# Patient Record
Sex: Female | Born: 1990
Health system: Southern US, Community
[De-identification: ages and names within clinical notes are randomized; demographics above are authoritative.]

## PROBLEM LIST (undated history)

## (undated) DIAGNOSIS — Z789 Other specified health status: Secondary | ICD-10-CM

## (undated) HISTORY — PX: NO PAST SURGERIES: SHX2092

---

## 2016-03-03 NOTE — L&D Delivery Note (Signed)
Delivery Note Pt was noted to be complete at 0553. She pushed well and at 6:31 AM a viable female was delivered via Vaginal, Spontaneous Delivery (Presentation: LOA ).  APGAR: 7, 9; weight: pending .  Cord clamped and cut by FOB; hospital cord blood sample collected. Placenta status: spont, intact .  Cord: 3 vessels  Anesthesia:  Epidural Episiotomy:  None Lacerations: 3rd degree;Perineal; repair performed by Dr Debroah Loop Suture Repair: 3.0 Est. Blood Loss (mL):  500cc  Mom to postpartum.  Baby to Couplet care / Skin to Skin.  Delivery by Dr Wonda Olds; I was present for the delivery and immediate recovery care.  Cam Hai CNM 10/20/2016, 6:56 AM

## 2016-03-16 ENCOUNTER — Emergency Department (HOSPITAL_COMMUNITY)
Admission: EM | Admit: 2016-03-16 | Discharge: 2016-03-16 | Disposition: A | Discharge status: Home / Self Care | Payer: BLUE CROSS/BLUE SHIELD | Attending: Emergency Medicine | Admitting: Emergency Medicine

## 2016-03-16 ENCOUNTER — Emergency Department (HOSPITAL_COMMUNITY): Payer: BLUE CROSS/BLUE SHIELD

## 2016-03-16 ENCOUNTER — Encounter (HOSPITAL_COMMUNITY): Payer: Self-pay | Admitting: *Deleted

## 2016-03-16 DIAGNOSIS — R102 Pelvic and perineal pain: Secondary | ICD-10-CM | POA: Diagnosis not present

## 2016-03-16 DIAGNOSIS — Z3A01 Less than 8 weeks gestation of pregnancy: Secondary | ICD-10-CM | POA: Insufficient documentation

## 2016-03-16 DIAGNOSIS — O209 Hemorrhage in early pregnancy, unspecified: Secondary | ICD-10-CM | POA: Diagnosis not present

## 2016-03-16 DIAGNOSIS — O4690 Antepartum hemorrhage, unspecified, unspecified trimester: Secondary | ICD-10-CM

## 2016-03-16 DIAGNOSIS — N939 Abnormal uterine and vaginal bleeding, unspecified: Secondary | ICD-10-CM

## 2016-03-16 LAB — CBC WITH DIFFERENTIAL/PLATELET
BASOS ABS: 0 10*3/uL (ref 0.0–0.1)
BASOS PCT: 0 %
EOS ABS: 0 10*3/uL (ref 0.0–0.7)
Eosinophils Relative: 0 %
HCT: 33.4 % — ABNORMAL LOW (ref 36.0–46.0)
Hemoglobin: 11.5 g/dL — ABNORMAL LOW (ref 12.0–15.0)
Lymphocytes Relative: 22 %
Lymphs Abs: 1.5 10*3/uL (ref 0.7–4.0)
MCH: 27.1 pg (ref 26.0–34.0)
MCHC: 34.4 g/dL (ref 30.0–36.0)
MCV: 78.6 fL (ref 78.0–100.0)
MONO ABS: 0.4 10*3/uL (ref 0.1–1.0)
Monocytes Relative: 6 %
NEUTROS ABS: 5 10*3/uL (ref 1.7–7.7)
Neutrophils Relative %: 72 %
PLATELETS: 209 10*3/uL (ref 150–400)
RBC: 4.25 MIL/uL (ref 3.87–5.11)
RDW: 12.9 % (ref 11.5–15.5)
WBC: 6.9 10*3/uL (ref 4.0–10.5)

## 2016-03-16 LAB — COMPREHENSIVE METABOLIC PANEL
ALK PHOS: 49 U/L (ref 38–126)
ALT: 13 U/L — AB (ref 14–54)
ANION GAP: 10 (ref 5–15)
AST: 20 U/L (ref 15–41)
Albumin: 4 g/dL (ref 3.5–5.0)
BILIRUBIN TOTAL: 0.3 mg/dL (ref 0.3–1.2)
BUN: 8 mg/dL (ref 6–20)
CALCIUM: 9.6 mg/dL (ref 8.9–10.3)
CO2: 22 mmol/L (ref 22–32)
CREATININE: 0.69 mg/dL (ref 0.44–1.00)
Chloride: 103 mmol/L (ref 101–111)
GFR calc non Af Amer: >60 mL/min (ref >60)
Glucose, Bld: 74 mg/dL (ref 65–99)
Potassium: 3.1 mmol/L — ABNORMAL LOW (ref 3.5–5.1)
Sodium: 135 mmol/L (ref 135–145)
TOTAL PROTEIN: 7.4 g/dL (ref 6.5–8.1)

## 2016-03-16 LAB — URINALYSIS, ROUTINE W REFLEX MICROSCOPIC
BACTERIA UA: NONE SEEN
BILIRUBIN URINE: NEGATIVE
GLUCOSE, UA: NEGATIVE mg/dL
Ketones, ur: NEGATIVE mg/dL
LEUKOCYTES UA: NEGATIVE
NITRITE: NEGATIVE
PH: 6 (ref 5.0–8.0)
Protein, ur: NEGATIVE mg/dL
SPECIFIC GRAVITY, URINE: 1.02 (ref 1.005–1.030)

## 2016-03-16 LAB — WET PREP, GENITAL
CLUE CELLS WET PREP: NONE SEEN
Sperm: NONE SEEN
TRICH WET PREP: NONE SEEN
Yeast Wet Prep HPF POC: NONE SEEN

## 2016-03-16 LAB — PREGNANCY, URINE: Preg Test, Ur: POSITIVE — AB

## 2016-03-16 LAB — HCG, QUANTITATIVE, PREGNANCY: HCG, BETA CHAIN, QUANT, S: 200534 m[IU]/mL — AB (ref <5)

## 2016-03-16 NOTE — ED Provider Notes (Signed)
[facility]-EMERGENCY DEPT Provider Note   CSN: [id number] Arrival date & time: 03/16/16  4098     History   Chief Complaint Chief Complaint  Patient presents with  . Abdominal Pain  . Vaginal Bleeding    HPI Jacqueline Gates is a 26 y.o. G1P0  female who presents with abdominal pain and vaginal bleeding. No significant PMH. Husband is at bedside who interprets. He states that the patient was in the shower last night and noticed a small amount of vaginal bleeding. She has not had any since then. She reports at the time she has some lower abdominal pain as well. None currently. They have not seen an OBGYN. She took a home pregnancy test which was positive several weeks ago. No fever, chills, nausea, vomiting, diarrhea, dysuria, vaginal discharge. LMP was Oct 25 (~ 2 months pregnant).  HPI  History reviewed. No pertinent past medical history.  There are no active problems to display for this patient.   History reviewed. No pertinent surgical history.  OB History    Gravida Para Term Preterm AB Living   1             SAB TAB Ectopic Multiple Live Births                   Home Medications    Prior to Admission medications   Not on File    Family History History reviewed. No pertinent family history.  Social History Social History  Substance Use Topics  . Smoking status: Never Smoker  . Smokeless tobacco: Not on file  . Alcohol use No     Allergies   Patient has no known allergies.   Review of Systems Review of Systems  Constitutional: Negative for chills and fever.  Respiratory: Negative for shortness of breath.   Cardiovascular: Negative for chest pain.  Gastrointestinal: Positive for abdominal pain. Negative for diarrhea, nausea and vomiting.  Genitourinary: Positive for pelvic pain and vaginal bleeding. Negative for dysuria and vaginal discharge.  All other systems reviewed and are negative.    Physical Exam Updated Vital Signs BP 101/63 (BP Location: Left  Arm)   Pulse 93   Temp 97.7 F (36.5 C) (Oral)   Resp 18   LMP 12/26/2015 (Approximate)   SpO2 99%   Physical Exam  Constitutional: She is oriented to person, place, and time. She appears well-developed and well-nourished. No distress.  NAD  HENT:  Head: Normocephalic and atraumatic.  Eyes: Conjunctivae are normal. Pupils are equal, round, and reactive to light. Right eye exhibits no discharge. Left eye exhibits no discharge. No scleral icterus.  Neck: Normal range of motion.  Cardiovascular: Normal rate and regular rhythm.  Exam reveals no gallop and no friction rub.   No murmur heard. Pulmonary/Chest: Effort normal and breath sounds normal. No respiratory distress. She has no wheezes. She has no rales. She exhibits no tenderness.  Abdominal: Soft. Bowel sounds are normal. She exhibits no distension and no mass. There is tenderness. There is no rebound and no guarding. No hernia.  Mild periumbilical tenderness  Genitourinary:  Genitourinary Comments: No inguinal lymphadenopathy or inguinal hernia noted. Normal external genitalia. No pain with speculum insertion. Closed cervical os. There is a mucus-like discharge coming from cervix mixed with bright red blood. On bimanual examination no adnexal tenderness or cervical motion tenderness. Chaperone present during exam.    Neurological: She is alert and oriented to person, place, and time.  Skin: Skin is warm and dry.  Psychiatric: She has a normal mood and affect. Her behavior is normal.  Nursing note and vitals reviewed.    ED Treatments / Results  Labs (all labs ordered are listed, but only abnormal results are displayed) Labs Reviewed  WET PREP, GENITAL - Abnormal; Notable for the following:       Result Value   WBC, Wet Prep HPF POC FEW (*)    All other components within normal limits  HCG, QUANTITATIVE, PREGNANCY - Abnormal; Notable for the following:    hCG, Beta Chain, Quant, S 200,534 (*)    All other components within  normal limits  CBC WITH DIFFERENTIAL/PLATELET - Abnormal; Notable for the following:    Hemoglobin 11.5 (*)    HCT 33.4 (*)    All other components within normal limits  URINALYSIS, ROUTINE W REFLEX MICROSCOPIC - Abnormal; Notable for the following:    APPearance HAZY (*)    Hgb urine dipstick MODERATE (*)    Squamous Epithelial / LPF 6-30 (*)    All other components within normal limits  COMPREHENSIVE METABOLIC PANEL - Abnormal; Notable for the following:    Potassium 3.1 (*)    ALT 13 (*)    All other components within normal limits  PREGNANCY, URINE - Abnormal; Notable for the following:    Preg Test, Ur POSITIVE (*)    All other components within normal limits  GC/CHLAMYDIA PROBE AMP () NOT AT [facility]    EKG  EKG Interpretation None       Radiology No results found.  Procedures Procedures (including critical care time)  Medications Ordered in ED Medications - No data to display   Initial Impression / Assessment and Plan / ED Course  I have reviewed the triage vital signs and the nursing notes.  Pertinent labs & imaging results that were available during my care of the patient were reviewed by me and considered in my medical decision making (see chart for details).  Clinical Course    26 year old female presents with vaginal bleeding. BP is low and pt states this is normal for her. She has one reading of tachycardia on d/c. Otherwise vitals are normal. Pelvic exam remarkable for small amount of bright red blood from cervix. CBC remarkable for mild anemia. CMP remarkable for mild hypokalemia. Beta hcg is 200,534, consistent with history. UA remarkable for hgb, and 6-30 WBC however appears contaminated. US shows IUP with HR of 153 with EGA of [redacted] weeks 2 days. Patient does not currently have OBGYN. Encouraged close follow up since this is a threatened abortion [facility]. Patient is NAD, non-toxic, with stable VS. Patient is informed of  clinical course, understands medical decision making process, and agrees with plan. Opportunity for questions provided and all questions answered. Return precautions given.   Final Clinical Impressions(s) / ED Diagnoses   Final diagnoses:  Vaginal bleeding  Vaginal bleeding in pregnancy    New Prescriptions New Prescriptions   No medications on file     Bethel BornKelly Marie Rashee Marschall, PA-C 03/19/16 1350    Vanetta MuldersScott Zackowski, MD 03/25/16 1740

## 2016-03-16 NOTE — ED Triage Notes (Signed)
Pt reports being pregnant. Lmp was October. Last night had some n/v, abd pain and reprots bright red bleeding, no clots.

## 2016-03-16 NOTE — ED Notes (Signed)
Pt on way to US.

## 2016-03-16 NOTE — ED Notes (Signed)
Low BP normal for patient. EDP aware.

## 2016-03-17 LAB — GC/CHLAMYDIA PROBE AMP (~~LOC~~) NOT AT ARMC
CHLAMYDIA, DNA PROBE: NEGATIVE
Neisseria Gonorrhea: NEGATIVE

## 2016-03-26 ENCOUNTER — Encounter: Payer: Self-pay | Admitting: Student

## 2016-03-26 ENCOUNTER — Ambulatory Visit (INDEPENDENT_AMBULATORY_CARE_PROVIDER_SITE_OTHER): Payer: BLUE CROSS/BLUE SHIELD | Admitting: Student

## 2016-03-26 VITALS — BP 82/47 | HR 84 | Wt 104.3 lb

## 2016-03-26 DIAGNOSIS — Z124 Encounter for screening for malignant neoplasm of cervix: Secondary | ICD-10-CM

## 2016-03-26 DIAGNOSIS — Z23 Encounter for immunization: Secondary | ICD-10-CM | POA: Diagnosis not present

## 2016-03-26 DIAGNOSIS — Z3400 Encounter for supervision of normal first pregnancy, unspecified trimester: Secondary | ICD-10-CM

## 2016-03-26 DIAGNOSIS — Z113 Encounter for screening for infections with a predominantly sexual mode of transmission: Secondary | ICD-10-CM | POA: Diagnosis not present

## 2016-03-26 DIAGNOSIS — Z3401 Encounter for supervision of normal first pregnancy, first trimester: Secondary | ICD-10-CM | POA: Insufficient documentation

## 2016-03-26 DIAGNOSIS — O200 Threatened abortion: Secondary | ICD-10-CM

## 2016-03-26 DIAGNOSIS — Z3490 Encounter for supervision of normal pregnancy, unspecified, unspecified trimester: Secondary | ICD-10-CM | POA: Insufficient documentation

## 2016-03-26 MED ORDER — PRENATAL 27-0.8 MG PO TABS: 1.0000 | ORAL_TABLET | Freq: Every day | ORAL | 5 refills | Status: DC

## 2016-03-26 NOTE — Patient Instructions (Signed)
First Trimester of Pregnancy  The first trimester of pregnancy is from week 1 until the end of week 12 (months 1 through 3). A week after a sperm fertilizes an egg, the egg will implant on the wall of the uterus. This embryo will begin to develop into a baby. Genes from you and your partner are forming the baby. The female genes determine whether the baby is a boy or a girl. At 6-8 weeks, the eyes and face are formed, and the heartbeat can be seen on ultrasound. At the end of 12 weeks, all the baby's organs are formed.   Now that you are pregnant, you will want to do everything you can to have a healthy baby. Two of the most important things are to get good prenatal care and to follow your health care provider's instructions. Prenatal care is all the medical care you receive before the baby's birth. This care will help prevent, find, and treat any problems during the pregnancy and childbirth.  BODY CHANGES  Your body goes through many changes during pregnancy. The changes vary from woman to woman.   · You may gain or lose a couple of pounds at first.  · You may feel sick to your stomach (nauseous) and throw up (vomit). If the vomiting is uncontrollable, call your health care provider.  · You may tire easily.  · You may develop headaches that can be relieved by medicines approved by your health care provider.  · You may urinate more often. Painful urination may mean you have a bladder infection.  · You may develop heartburn as a result of your pregnancy.  · You may develop constipation because certain hormones are causing the muscles that push waste through your intestines to slow down.  · You may develop hemorrhoids or swollen, bulging veins (varicose veins).  · Your breasts may begin to grow larger and become tender. Your nipples may stick out more, and the tissue that surrounds them (areola) may become darker.  · Your gums may bleed and may be sensitive to brushing and flossing.   · Dark spots or blotches (chloasma, mask of pregnancy) may develop on your face. This will likely fade after the baby is born.  · Your menstrual periods will stop.  · You may have a loss of appetite.  · You may develop cravings for certain kinds of food.  · You may have changes in your emotions from day to day, such as being excited to be pregnant or being concerned that something may go wrong with the pregnancy and baby.  · You may have more vivid and strange dreams.  · You may have changes in your hair. These can include thickening of your hair, rapid growth, and changes in texture. Some women also have hair loss during or after pregnancy, or hair that feels dry or thin. Your hair will most likely return to normal after your baby is born.  WHAT TO EXPECT AT YOUR PRENATAL VISITS  During a routine prenatal visit:  · You will be weighed to make sure you and the baby are growing normally.  · Your blood pressure will be taken.  · Your abdomen will be measured to track your baby's growth.  · The fetal heartbeat will be listened to starting around week 10 or 12 of your pregnancy.  · Test results from any previous visits will be discussed.  Your health care provider may ask you:  · How you are feeling.  · If you   are feeling the baby move.  · If you have had any abnormal symptoms, such as leaking fluid, bleeding, severe headaches, or abdominal cramping.  · If you are using any tobacco products, including cigarettes, chewing tobacco, and electronic cigarettes.  · If you have any questions.  Other tests that may be performed during your first trimester include:  · Blood tests to find your blood type and to check for the presence of any previous infections. They will also be used to check for low iron levels (anemia) and Rh antibodies. Later in the pregnancy, blood tests for diabetes will be done along with other tests if problems develop.  · Urine tests to check for infections, diabetes, or protein in the urine.   · An ultrasound to confirm the proper growth and development of the baby.  · An amniocentesis to check for possible genetic problems.  · Fetal screens for spina bifida and Down syndrome.  · You may need other tests to make sure you and the baby are doing well.  · HIV (human immunodeficiency virus) testing. Routine prenatal testing includes screening for HIV, unless you choose not to have this test.  HOME CARE INSTRUCTIONS   Medicines  · Follow your health care provider's instructions regarding medicine use. Specific medicines may be either safe or unsafe to take during pregnancy.  · Take your prenatal vitamins as directed.  · If you develop constipation, try taking a stool softener if your health care provider approves.  Diet  · Eat regular, well-balanced meals. Choose a variety of foods, such as meat or vegetable-based protein, fish, milk and low-fat dairy products, vegetables, fruits, and whole grain breads and cereals. Your health care provider will help you determine the amount of weight gain that is right for you.  · Avoid raw meat and uncooked cheese. These carry germs that can cause birth defects in the baby.  · Eating four or five small meals rather than three large meals a day may help relieve nausea and vomiting. If you start to feel nauseous, eating a few soda crackers can be helpful. Drinking liquids between meals instead of during meals also seems to help nausea and vomiting.  · If you develop constipation, eat more high-fiber foods, such as fresh vegetables or fruit and whole grains. Drink enough fluids to keep your urine clear or pale yellow.  Activity and Exercise  · Exercise only as directed by your health care provider. Exercising will help you:    Control your weight.    Stay in shape.    Be prepared for labor and delivery.  · Experiencing pain or cramping in the lower abdomen or low back is a good sign that you should stop exercising. Check with your health care provider  before continuing normal exercises.  · Try to avoid standing for long periods of time. Move your legs often if you must stand in one place for a long time.  · Avoid heavy lifting.  · Wear low-heeled shoes, and practice good posture.  · You may continue to have sex unless your health care provider directs you otherwise.  Relief of Pain or Discomfort  · Wear a good support bra for breast tenderness.    · Take warm sitz baths to soothe any pain or discomfort caused by hemorrhoids. Use hemorrhoid cream if your health care provider approves.    · Rest with your legs elevated if you have leg cramps or low back pain.  · If you develop varicose veins in your   legs, wear support hose. Elevate your feet for 15 minutes, 3-4 times a day. Limit salt in your diet.  Prenatal Care  · Schedule your prenatal visits by the twelfth week of pregnancy. They are usually scheduled monthly at first, then more often in the last 2 months before delivery.  · Write down your questions. Take them to your prenatal visits.  · Keep all your prenatal visits as directed by your health care provider.  Safety  · Wear your seat belt at all times when driving.  · Make a list of emergency phone numbers, including numbers for family, friends, the hospital, and police and fire departments.  General Tips  · Ask your health care provider for a referral to a local prenatal education class. Begin classes no later than at the beginning of month 6 of your pregnancy.  · Ask for help if you have counseling or nutritional needs during pregnancy. Your health care provider can offer advice or refer you to specialists for help with various needs.  · Do not use hot tubs, steam rooms, or saunas.  · Do not douche or use tampons or scented sanitary pads.  · Do not cross your legs for long periods of time.  · Avoid cat litter boxes and soil used by cats. These carry germs that can cause birth defects in the baby and possibly loss of the fetus by miscarriage or stillbirth.   · Avoid all smoking, herbs, alcohol, and medicines not prescribed by your health care provider. Chemicals in these affect the formation and growth of the baby.  · Do not use any tobacco products, including cigarettes, chewing tobacco, and electronic cigarettes. If you need help quitting, ask your health care provider. You may receive counseling support and other resources to help you quit.  · Schedule a dentist appointment. At home, brush your teeth with a soft toothbrush and be gentle when you floss.  SEEK MEDICAL CARE IF:   · You have dizziness.  · You have mild pelvic cramps, pelvic pressure, or nagging pain in the abdominal area.  · You have persistent nausea, vomiting, or diarrhea.  · You have a bad smelling vaginal discharge.  · You have pain with urination.  · You notice increased swelling in your face, hands, legs, or ankles.  SEEK IMMEDIATE MEDICAL CARE IF:   · You have a fever.  · You are leaking fluid from your vagina.  · You have spotting or bleeding from your vagina.  · You have severe abdominal cramping or pain.  · You have rapid weight gain or loss.  · You vomit blood or material that looks like coffee grounds.  · You are exposed to German measles and have never had them.  · You are exposed to fifth disease or chickenpox.  · You develop a severe headache.  · You have shortness of breath.  · You have any kind of trauma, such as from a fall or a car accident.     This information is not intended to replace advice given to you by your health care provider. Make sure you discuss any questions you have with your health care provider.     Document Released: 02/11/2001 Document Revised: 03/10/2014 Document Reviewed: 12/28/2012  Elsevier Interactive Patient Education ©2017 [employer]

## 2016-03-26 NOTE — Progress Notes (Addendum)
Subjective:    Jacqueline Gates is a 26 year old G1P0 at [redacted]w[redacted]d by early [place] being seen today for her first obstetrical visit.  Her obstetrical history is significant for nothing. Patient does intend to breast feed. Pregnancy history fully reviewed. [employer] interpreter used for interview.   Patient reports bleeding. She was seen in the [facility] ED on 03-17-2016 for bleeding. A viable SIUP was seen on [place] at [facility] Ed with positive cardiac activity. Patient states that she has continued to spot since then. She and her husband are extremely anxious about the pregnancy's viability.    Vitals:   03/26/16 1435  BP: (!) 82/47  Pulse: 84  Weight: 104 lb 4.8 oz (47.3 kg)    HISTORY: OB History  Gravida Para Term Preterm AB Living  1            SAB TAB Ectopic Multiple Live Births               # Outcome Date GA Lbr Len/2nd Weight Sex Delivery Anes PTL Lv  1 Current              History reviewed. No pertinent past medical history. History reviewed. No pertinent surgical history. History reviewed. No pertinent family history.   Exam    Uterus:   S=D  Pelvic Exam:    Perineum: No Hemorrhoids   Vulva: normal   Vagina:  normal mucosa, normal discharge, scant blood   pH:    Cervix: no cervical motion tenderness, no lesions, nulliparous appearance and small amount of bright red blood extruding from the cervical os. Possible tissue at the os, although difficult to tell.    Adnexa: normal adnexa   Bony Pelvis: not assessed  System: Breast:  normal appearance, no masses or tenderness   Skin: normal coloration and turgor, no rashes    Neurologic: oriented, normal, normal mood, gait normal; reflexes normal and symmetric, no focal deficits   Extremities: normal strength, tone, and muscle mass, no deformities, ROM of all joints is normal, no evidence of joint instability   HEENT PERRLA, sclera clear, anicteric and neck supple with midline trachea   Mouth/Teeth mucous membranes moist, pharynx  normal without lesions and dental hygiene good   Neck supple   Cardiovascular: regular rate and rhythm   Respiratory:  appears well, vitals normal, no respiratory distress, acyanotic, normal RR, ear and throat exam is normal, neck free of mass or lymphadenopathy, chest clear, no wheezing, crepitations, rhonchi, normal symmetric air entry, S1, S2 normal, no gallop, no murmur, chest clear, no JVD, no HSM, no edema   Abdomen: soft, non-tender; bowel sounds normal; no masses,  no organomegaly   Urinary: urethral meatus normal      Assessment:    Pregnancy: G1P0    Patient Active Problem List   Diagnosis Date Noted  . Threatened abortion 03/27/2016  . Encounter for supervision of low-risk first pregnancy in first trimester 03/26/2016  . Supervision of normal pregnancy 03/26/2016       Plan:     Initial labs drawn. Prenatal vitamins. RX sent to pharmacy Problem list reviewed and updated. Genetic Screening discussed First Screen: ordered. Patient will have genetic screening apt scheduled after [place] appointment on 04-04-2016  Ultrasound discussed; fetal survey: patient will have follow up US for viability on 04-04-2016. .  Follow up in 4 weeks. 50% of 45 min visit spent on counseling and coordination of care.   I reviewed with patient when to  return to the [facility] (increased bleeding, cramping, passing of clots). I stressed to her that, if she is having a miscarriage, there will be nothing we can do to prevent it. Reassured her that nothing she did could have caused her (potential) miscarriage. Patient and husband verbalized understanding.    Charlesetta GaribaldiKathryn Lorraine Kooistra CNM 03/27/2016

## 2016-03-26 NOTE — Progress Notes (Signed)
Patient has some vaginal bleeding after she uses the restroom. Concern about her low blood pressure Husband will interpret for patient

## 2016-03-27 DIAGNOSIS — O200 Threatened abortion: Secondary | ICD-10-CM | POA: Insufficient documentation

## 2016-03-27 LAB — PAIN MGMT, PROFILE 6 CONF W/O MM, U
6 Acetylmorphine: NEGATIVE ng/mL (ref <10)
ALCOHOL METABOLITES: NEGATIVE ng/mL (ref <500)
AMPHETAMINES: NEGATIVE ng/mL (ref <500)
BENZODIAZEPINES: NEGATIVE ng/mL (ref <100)
Barbiturates: NEGATIVE ng/mL (ref <300)
CREATININE: 33.7 mg/dL (ref >=20.0)
Cocaine Metabolite: NEGATIVE ng/mL (ref <150)
MARIJUANA METABOLITE: NEGATIVE ng/mL (ref <20)
Methadone Metabolite: NEGATIVE ng/mL (ref <100)
OPIATES: NEGATIVE ng/mL (ref <100)
Oxidant: NEGATIVE ug/mL (ref <200)
Oxycodone: NEGATIVE ng/mL (ref <100)
PH: 7.58 (ref 4.5–9.0)
Phencyclidine: NEGATIVE ng/mL (ref <25)
Please note:: 0

## 2016-03-27 LAB — CYTOLOGY - PAP
Chlamydia: NEGATIVE
DIAGNOSIS: NEGATIVE
NEISSERIA GONORRHEA: NEGATIVE

## 2016-03-27 LAB — PRENATAL PROFILE (SOLSTAS)
Antibody Screen: NEGATIVE
BASOS ABS: 0 {cells}/uL (ref 0–200)
Basophils Relative: 0 %
Eosinophils Absolute: 71 cells/uL (ref 15–500)
Eosinophils Relative: 1 %
HEMATOCRIT: 35 % (ref 35.0–45.0)
HEMOGLOBIN: 11.3 g/dL — AB (ref 11.7–15.5)
HEP B S AG: NEGATIVE
HIV 1&2 Ab, 4th Generation: NONREACTIVE
LYMPHS ABS: 1917 {cells}/uL (ref 850–3900)
LYMPHS PCT: 27 %
MCH: 26.5 pg — ABNORMAL LOW (ref 27.0–33.0)
MCHC: 32.3 g/dL (ref 32.0–36.0)
MCV: 82 fL (ref 80.0–100.0)
MPV: 10.6 fL (ref 7.5–12.5)
Monocytes Absolute: 497 cells/uL (ref 200–950)
Monocytes Relative: 7 %
NEUTROS PCT: 65 %
Neutro Abs: 4615 cells/uL (ref 1500–7800)
Platelets: 250 10*3/uL (ref 140–400)
RBC: 4.27 MIL/uL (ref 3.80–5.10)
RDW: 14.6 % (ref 11.0–15.0)
RUBELLA: 3.53 {index} — AB (ref <0.90)
Rh Type: POSITIVE
WBC: 7.1 10*3/uL (ref 3.8–10.8)

## 2016-03-27 LAB — CULTURE, OB URINE: Organism ID, Bacteria: NO GROWTH

## 2016-03-28 LAB — HEMOGLOBINOPATHY EVALUATION
HEMATOCRIT: 35 % (ref 35.0–45.0)
HGB A: 68.4 % — AB (ref >96.0)
HGB E QUANTITATION: 26.7 % — AB
Hemoglobin: 11.3 g/dL — ABNORMAL LOW (ref 11.7–15.5)
Hgb A2 Quant: 3.9 % — ABNORMAL HIGH (ref 1.8–3.5)
Hgb F Quant: <1 % (ref <2.0)
MCH: 26.5 pg — ABNORMAL LOW (ref 27.0–33.0)
MCV: 82 fL (ref 80.0–100.0)
RBC: 4.27 MIL/uL (ref 3.80–5.10)
RDW: 14.6 % (ref 11.0–15.0)

## 2016-04-04 ENCOUNTER — Ambulatory Visit: Payer: BLUE CROSS/BLUE SHIELD | Admitting: *Deleted

## 2016-04-04 ENCOUNTER — Ambulatory Visit (HOSPITAL_COMMUNITY)
Admission: RE | Admit: 2016-04-04 | Discharge: 2016-04-04 | Disposition: A | Discharge status: Home / Self Care | Payer: BLUE CROSS/BLUE SHIELD | Source: Ambulatory Visit | Attending: Student | Admitting: Student

## 2016-04-04 DIAGNOSIS — Z3401 Encounter for supervision of normal first pregnancy, first trimester: Secondary | ICD-10-CM

## 2016-04-04 DIAGNOSIS — O200 Threatened abortion: Secondary | ICD-10-CM

## 2016-04-04 DIAGNOSIS — O209 Hemorrhage in early pregnancy, unspecified: Secondary | ICD-10-CM | POA: Insufficient documentation

## 2016-04-04 DIAGNOSIS — Z712 Person consulting for explanation of examination or test findings: Secondary | ICD-10-CM

## 2016-04-04 DIAGNOSIS — Z3A10 10 weeks gestation of pregnancy: Secondary | ICD-10-CM | POA: Diagnosis not present

## 2016-04-04 NOTE — Progress Notes (Addendum)
[employer] video interpreter Lynden AngCathy # [phone] used for encounter.  Fetal viability confirmed by US today. Pt and husband informed of results after consultation with Dr. Macon LargeAnyanwu. Both parties had multiple questions regarding prenatal care (where to receive, normal course of care, etc)  EDD 10/30/16.  Photo of fetus provided to pt. Medication reconciliation completed. Total time spent with pt and interpreter = 20 minutes.

## 2016-04-05 LAB — CFVANTAGE CYSTIC FIB EXP SCREEN

## 2016-04-15 ENCOUNTER — Encounter (HOSPITAL_COMMUNITY): Payer: Self-pay | Admitting: Student

## 2016-04-21 ENCOUNTER — Ambulatory Visit (HOSPITAL_COMMUNITY)
Admission: RE | Admit: 2016-04-21 | Discharge: 2016-04-21 | Disposition: A | Discharge status: Home / Self Care | Payer: BLUE CROSS/BLUE SHIELD | Source: Ambulatory Visit | Attending: Student | Admitting: Student

## 2016-04-21 ENCOUNTER — Encounter (HOSPITAL_COMMUNITY): Payer: Self-pay

## 2016-04-21 ENCOUNTER — Other Ambulatory Visit: Payer: Self-pay | Admitting: Student

## 2016-04-21 DIAGNOSIS — Z3A12 12 weeks gestation of pregnancy: Secondary | ICD-10-CM | POA: Diagnosis not present

## 2016-04-21 DIAGNOSIS — Z3682 Encounter for antenatal screening for nuchal translucency: Secondary | ICD-10-CM

## 2016-04-21 DIAGNOSIS — Z3401 Encounter for supervision of normal first pregnancy, first trimester: Secondary | ICD-10-CM

## 2016-04-21 HISTORY — DX: Other specified health status: Z78.9

## 2016-04-24 ENCOUNTER — Ambulatory Visit (HOSPITAL_COMMUNITY): Payer: BLUE CROSS/BLUE SHIELD

## 2016-04-24 ENCOUNTER — Other Ambulatory Visit (HOSPITAL_COMMUNITY): Payer: BLUE CROSS/BLUE SHIELD

## 2016-04-24 ENCOUNTER — Other Ambulatory Visit: Payer: Self-pay

## 2016-04-28 ENCOUNTER — Ambulatory Visit (INDEPENDENT_AMBULATORY_CARE_PROVIDER_SITE_OTHER): Payer: BLUE CROSS/BLUE SHIELD | Admitting: Obstetrics & Gynecology

## 2016-04-28 DIAGNOSIS — Z3402 Encounter for supervision of normal first pregnancy, second trimester: Secondary | ICD-10-CM

## 2016-04-28 NOTE — Progress Notes (Signed)
[employer] audio interpreter used.  AFP next visit  Back pain--Tylenol and heating pad--mid lumber paraspinous  Lightheaded after standing.  Pt stood in todays exam pt stands with locked knees immediately on standing.  Pt instructed to not lock knees and to increase water intake.  Only drinking 8 ounces a day.  Anatomy US to be ordered next visit.     PRENATAL VISIT NOTE  Subjective:  Jacqueline Gates is a 26 y.o. G1P0 at 5448w4d being seen today for ongoing prenatal care.  She is currently monitored for the following issues for this low-risk pregnancy and has Encounter for supervision of low-risk first pregnancy in first trimester; Supervision of normal pregnancy; and Threatened abortion on her problem list.  Patient reports backache.  Contractions: Not present. Vag. Bleeding: None.  Movement: Absent. Denies leaking of fluid.   The following portions of the patient's history were reviewed and updated as appropriate: allergies, current medications, past family history, past medical history, past social history, past surgical history and problem list. Problem list updated.  Objective:   Vitals:   04/28/16 1112  BP: (!) 93/57  Pulse: 86  Weight: 105 lb 8 oz (47.9 kg)    Fetal Status: Fetal Heart Rate (bpm): 160   Movement: Absent     General:  Alert, oriented and cooperative. Patient is in no acute distress.  Skin: Skin is warm and dry. No rash noted.   Cardiovascular: Normal heart rate noted  Respiratory: Normal respiratory effort, no problems with respiration noted  Abdomen: Soft, gravid, appropriate for gestational age. Pain/Pressure: Absent     Pelvic:  Cervical exam deferred        Extremities: Normal range of motion.  Edema: None  Mental Status: Normal mood and affect. Normal behavior. Normal judgment and thought content.   Assessment and Plan:  Pregnancy: G1P0 at 348w4d  1. Encounter for supervision of normal first pregnancy in second trimester [employer] audio interpreter  used.  AFP next visit  Back pain--Tylenol and heating pad--mid lumber paraspinous  Lightheaded after standing.  Pt stood in todays exam pt stands with locked knees immediately on standing.  Pt instructed to not lock knees and to increase water intake.  Only drinking 8 ounces a day.  Anatomy US to be ordered next visit.    Preterm labor symptoms and general obstetric precautions including but not limited to vaginal bleeding, contractions, leaking of fluid and fetal movement were reviewed in detail with the patient. Please refer to After Visit Summary for other counseling recommendations.  Return in 4 weeks (on 05/26/2016).   Lesly DukesKelly H Telly Jawad, MD

## 2016-05-26 ENCOUNTER — Ambulatory Visit (INDEPENDENT_AMBULATORY_CARE_PROVIDER_SITE_OTHER): Payer: BLUE CROSS/BLUE SHIELD | Admitting: Medical

## 2016-05-26 VITALS — BP 102/62 | HR 79 | Wt 106.8 lb

## 2016-05-26 DIAGNOSIS — Z3402 Encounter for supervision of normal first pregnancy, second trimester: Secondary | ICD-10-CM

## 2016-05-26 NOTE — Progress Notes (Signed)
[place] interpreter "Truyen" [phone] used for visit

## 2016-05-26 NOTE — Patient Instructions (Signed)
Ba tháng th? hai c?a thai k? °(Second Trimester of Pregnancy) °Ba tháng th? hai là t? tu?n 13 ??n tu?n 28, tháng th? 4 ??n th? tháng th? 6. Ba tháng th? hai th??ng là th?i gian mà quý v? c?m th?y tho?i mái nh?t. C? th? c?a quý v? c?ng ?ã ?i?u ch?nh ?? phù h?p v?i vi?c mang thai, và quý v? b?t ??u c?m th?y th? ch?t t?t h?n. Thông th??ng, tình tr?ng nghén ?ã gi?m ?i ho?c h?t hoàn toàn, quý v? có th? có nhi?u n?ng l??ng h?n, và quý v? có th? ?n ngon mi?ng h?n. Ba tháng th? hai c?ng là th?i gian khi bào thai ?ang phát tri?n nhanh chóng. Vào cu?i tháng th? sáu, bào thai dài kho?ng 9 inch và n?ng kho?ng 1½ pao. Quý v? có th? b?t ??u c?m th?y em bé c? ??ng (??p) t? 18 ??n 20 tu?n c?a thai k?. °C? TH? THAY ??I °C? th? c?a quý v? tr?i qua r?t nhi?u thay ??i trong th?i gian mang thai. Nh?ng thay ??i khác nhau ? các ph? n? khác nhau. °· Cân n?ng c?a quý v? s? ti?p t?c t?ng. Quý v? s? nh?n th?y b?ng ph?ng to ra. °· Quý v? có th? b?t ??u có các v?t n?t trên hông, b?ng và ng?c. °· Quý v? có th? b? ?au ??u mà có th? gi?m ?au b?ng thu?c do chuyên gia ch?m sóc s?c kh?e ch?p thu?n. °· Quý v? có th? ?i ti?u th??ng xuyên h?n vì bào thai ép vào bàng quang c?a quý v?. °· Quý v? có th? b? ho?c ti?p t?c b? ? nóng do vi?c mang thai. °· Quý v? có th? b? táo bón vì m?t s? hoóc môn nh?t ??nh ?ang làm các c? có ch?c n?ng ??y ch?t th?i qua ???ng ru?t ho?t ??ng ch?m l?i. °· Quý v? có th? b? b?nh tr? ho?c s?ng, ph?ng t?nh m?ch (giãn t?nh m?ch). °· Quý v? có th? b? ?au l?ng vì tr?ng l??ng t?ng và các hoóc môn mang thai làm l?ng các kh?p x??ng c?a quý v? ? khung ch?u và nh? là k?t qu? c?a s? thay ??i c?a tr?ng l??ng và c?a các c? h? tr? s? cân b?ng c?a quý v?. °· Ng?c quý v? s? ti?p t?c phát tri?n và nh?y c?m ?au. °· N??u (l?i) c?a quý v? có th? ch?y máu và có th? nh?y c?m v?i vi?c ?ánh r?ng và dùng ch? nha khoa. °· Các ??m ?en ho?c v?t ?en (xám da, khi mang thai) có th? xu?t hi?n trên m?t quý v?. V?t này có th? s? m? d?n sau khi em bé ???c sinh  ra. °· M?t ???ng s?m màu ch?y t? r?n ??n lông mu (linea nigra) có th? xu?t hi?n. V?t này có th? s? m? d?n sau khi em bé ???c sinh ra. °· Quý v? có nh?ng thay ??i v? tóc. Nh?ng thay ??i này có th? bao g?m tóc dày lên, tóc m?c nhanh h?n và thay ??i v? k?t c?u. M?t s? ph? n? c?ng b? r?ng tóc trong khi ho?c sau khi mang thai, ho?c tóc c?m th?y khô ho?c m?ng. Tóc c?a quý v? s? có nhi?u kh? n?ng s? tr? l?i bình th??ng sau khi em bé ???c sinh ra. °TRÔNG ??I ?I?U GÌ TRONG NH?NG L?N TH?M KHÁM TR??C KHI SINH °Trong m?t l?n khám tr??c khi sinh: °· Quý v? s? ???c cân n?ng ?? ??m b?o quý v? và thai nhi ?ang phát tri?n bình th??ng. °· Quý   v? s? ???c ?o huy?t áp. °· Quý v? s? ???c ?o b?ng ?? theo dõi s? phát tri?n c?a bé. °· S? nghe th?y nh?p tim thai. °· B?t k? k?t qu? ki?m tra nào trong l?n khám tr??c ?ó s? ???c th?o lu?n. °Chuyên gia ch?m sóc s?c kh?e có th? h?i quý v?: °· Quý v? c?m th?y th? nào. °· Quý v? có c?m th?y em bé c? ??ng. °· Li?u quý v? có b?t k? tri?u ch?ng b?t th??ng nào, ch?ng h?n nh? rò r? ch?t l?ng, ch?y máu, ?au ??u nghiêm tr?ng ho?c co th?t ? b?ng. °· Li?u quý v? có ?ang s? d?ng b?t k? s?n ph?m thu?c lá nào, bao g?m thu?c lá d?ng hút, thu?c lá d?ng nhai và thu?c lá ?i?n t?. °· Li?u quý v? có b?t k? câu h?i nào không. °Nh?ng ki?m tra khác có th? ???c th?c hi?n trong k? ba tháng th? hai c?a quý v? bao g?m: °· Xét nghi?m máu ?? ki?m tra: °? N?ng ?? s?t th?p (thi?u máu). °? Ti?u ???ng khi mang thai (gi?a tu?n 24 và tu?n 28). °? Kháng th? RH. °· Xét nghi?m n??c ti?u ?? ki?m tra nhi?m trùng, b?nh ti?u ???ng ho?c protein trong n??c ti?u. °· Siêu âm ?? xác ??nh s? t?ng tr??ng và phát tri?n phù h?p c?a em bé. °· Ch?c ?i ?? ki?m tra các v?n ?? v? di truy?n có th? có. °· Ki?m tra bào thai xem có b? n?t ??t s?ng ho?c h?i ch?ng Down hay không. °· Xét nghi?m HIV (Vi rút suy gi?m mi?n d?ch ? ng??i). Xét nghi?m tr??c khi sinh th??ng bao g?m sàng l?c HIV, tr? khi quý v? quy?t ??nh không làm xét nghi?m này. °H??NG D?N CH?M SÓC  T?I NHÀ °· Tránh t?t c? thu?c lá, th?o d??c, r??u và ma túy không ???c kê ??n. Các hóa ch?t này ?nh h??ng ??n s? hình thành và phát tri?n c?a em bé. °· Không s? d?ng b?t c? các s?n ph?m thu?c lá nào, bao g?m thu?c lá d?ng hút, thu?c lá d?ng nhai và thu?c lá ?i?n t?. N?u quý v? c?n giúp ?? ?? cai thu?c, hãy h?i chuyên gia ch?m sóc s?c kh?e. Quý v? có th? nh?n ???c h? tr? t? v?n và các ngu?n h? tr? khác ?? giúp quý v? b? thu?c lá. °· Tuân th? các ch? d?n c?a chuyên gia ch?m sóc s?c kh?e v? vi?c s? d?ng thu?c. Có nh?ng lo?i thu?c là an toàn ho?c không an toàn trong quá trình mang thai. °· Ch? t?p th? d?c theo ch? d?n c?a chuyên gia ch?m sóc s?c kh?e. B? co th?t t? cung là m?t d?u hi?u t?t ?? ng?ng t?p th? d?c. °· Ti?p t?c ?n các b?a ?n th??ng xuyên, lành m?nh. °· M?c m?t chi?c áo ng?c nâng ?? t?t cho vú b? nh?y c?m ?au. °· Không s? d?ng b?n t?m n??c nóng, phòng xông h?i, ho?c phòng t?m h?i. °· ?eo dây an toàn c?a quý v? m?i lúc khi lái xe. °· Tránh th?t s?ng, phô mai ch?a n?u chín, h?p v? sinh c?a mèo, và ??t v? sinh dành cho mèo. Nh?ng th? này mang m?m b?nh có th? gây d? t?t b?m sinh cho em bé. °· U?ng vitamin tr??c khi sinh c?a quý v?. °· U?ng 1500-2000 mg canxi m?i ngày b?t ??u t? tu?n th? 20 c?a thai k? cho ??n khi sinh con. °· Hãy th? dùng thu?c làm m?m phân (n?u chuyên gia ch?m sóc s?c   kh?e c?a quý v? ch?p thu?n) n?u quý v? b? táo bón. ?n thêm th?c ?n giàu ch?t x?, nh? rau t??i ho?c trái cây và ng? c?c nguyên h?t. U?ng nhi?u n??c ?? gi? cho n??c ti?u trong ho?c vàng nh?t. °· T?m b?n ng?i ?? làm d?u c?n ?au ho?c s? khó ch?u do b?nh tr? gây ra. S? d?ng kem tr? n?u chuyên gia ch?m sóc s?c kh?e ch?p thu?n. °· N?u quý v? b? giãn t?nh m?ch, hãy ?eo t?t h? tr?. Nâng cao chân c?a quý v? trong 15 phút, 3-4 l?n m?t ngày. H?n ch? mu?i trong ch? ?? ?n c?a quý v?. °· Tránh nâng v?t n?ng, hay ?i giày th?p gót, và luy?n t?p t? th? phù h?p. °· Ngh? ng?i v?i ?ôi chân c?a quý v? nâng cao n?u quý v? b? chu?t rút chân ho?c ?au vùng  th?t l?ng. °· ??n khám nha s? n?u quý v? ch?a khám trong th?i gian quý v? mang thai. S? d?ng m?t bàn ch?i m?m ?? ch?i r?ng c?a quý v? và hãy nh? nhàng khi dùng ch? nha khoa. °· Có th? ???c ti?p t?c có quan h? tình d?c tr? khi chuyên gia ch?m sóc s?c kh?e yêu c?u khác. °· Ti?p t?c ??n m?i l?n h?n khám tr??c khi sinh theo ch? d?n c?a chuyên gia ch?m sóc s?c kh?e c?a quý v?. ° °?I KHÁM N?U: °· Quý v? b? hoa m?t. °· Quý v? b? co th?t vùng ch?u, áp l?c lên vùng ch?u, ?au âm ? ? vùng b?ng. °· Quý v? b? bu?n nôn, nôn m?a ho?c tiêu ch?y liên t?c. °· Quý v? có d?ch âm ??o mùi khó ch?u. °· Quý v? b? ?au khi ?i ti?u. ° °NGAY L?P T?C ?I KHÁM N?U: °· Quý v? b? s?t. °· Quý v? b? rò r? d?ch t? âm ??o. °· Quý v? có ra ??m máu ho?c ch?y máu t? âm ??o. °· Quý v? b? co th?t nghiêm tr?ng ho?c ?au ? b?ng. °· Quý v? t?ng cân ho?c gi?m cân nhanh. °· Quý v? b? khó th? cùng v?i ?au ng?c. °· Quý v? th?y s?ng b?t ng? ho?c r?t to ? m?t, tay, m?t cá chân, bàn chân ho?c c?ng chân. °· Quý v? không th?y em bé c? ??ng trong h?n m?t gi?. °· Quý v? b? ?au ??u nghiêm tr?ng mà không h?t sau khi dùng thu?c. °· Quý v? b? thay ??i th? l?c. ° °Thông tin này không nh?m m?c ?ích thay th? cho l?i khuyên mà chuyên gia ch?m sóc s?c kh?e nói v?i quý v?. Hãy b?o ??m quý v? ph?i th?o lu?n b?t k? v?n ?? gì mà quý v? có v?i chuyên gia ch?m sóc s?c kh?e c?a quý v?. °Document Released: 03/16/2015 Document Revised: 03/16/2015 Document Reviewed: 04/20/2012 °Elsevier Interactive Patient Education © 2017 Elsevier Inc. ° °

## 2016-05-26 NOTE — Progress Notes (Signed)
   PRENATAL VISIT NOTE  Subjective:  Jacqueline Gates is a 26 y.o. G1P0 at 7243w4d being seen today for ongoing prenatal care.  She is currently monitored for the following issues for this low-risk pregnancy and has Encounter for supervision of low-risk first pregnancy in first trimester; Supervision of normal pregnancy; and Threatened abortion on her problem list.  Patient reports no complaints.   . Vag. Bleeding: None.  Movement: Present. Denies leaking of fluid.   The following portions of the patient's history were reviewed and updated as appropriate: allergies, current medications, past family history, past medical history, past social history, past surgical history and problem list. Problem list updated.  Objective:   Vitals:   05/26/16 0920  BP: 102/62  Pulse: 79  Weight: 106 lb 12.8 oz (48.4 kg)    Fetal Status: Fetal Heart Rate (bpm): 152   Movement: Present     General:  Alert, oriented and cooperative. Patient is in no acute distress.  Skin: Skin is warm and dry. No rash noted.   Cardiovascular: Normal heart rate noted  Respiratory: Normal respiratory effort, no problems with respiration noted  Abdomen: Soft, gravid, appropriate for gestational age. Pain/Pressure: Present     Pelvic:  Cervical exam deferred        Extremities: Normal range of motion.  Edema: None  Mental Status: Normal mood and affect. Normal behavior. Normal judgment and thought content.   Assessment and Plan:  Pregnancy: G1P0 at 4643w4d  1. Encounter for supervision of normal first pregnancy in second trimester - US MFM OB COMP + 14 WK; scheduled - AFP today, First screen normal  second trimester warning signs and symptoms and general obstetric precautions including but not limited to vaginal bleeding, contractions, leaking of fluid and fetal movement were reviewed in detail with the patient. Please refer to After Visit Summary for other counseling recommendations.  Return in about 4 weeks (around 06/23/2016)  for LOB.   Marny LowensteinJulie N Tyller Bowlby, PA-C

## 2016-06-05 LAB — AFP, SERUM, OPEN SPINA BIFIDA
AFP MoM: 1.34
AFP Value: 63.8 ng/mL
Gest. Age on Collection Date: 17 weeks
Maternal Age At EDD: 28.1 years
OSBR RISK 1 IN: 4273
TEST RESULTS AFP: NEGATIVE
WEIGHT: 106 [lb_av]

## 2016-06-12 ENCOUNTER — Other Ambulatory Visit: Payer: Self-pay | Admitting: Medical

## 2016-06-12 ENCOUNTER — Ambulatory Visit (HOSPITAL_COMMUNITY)
Admission: RE | Admit: 2016-06-12 | Discharge: 2016-06-12 | Disposition: A | Discharge status: Home / Self Care | Payer: BLUE CROSS/BLUE SHIELD | Source: Ambulatory Visit | Attending: Medical | Admitting: Medical

## 2016-06-12 DIAGNOSIS — Z3A19 19 weeks gestation of pregnancy: Secondary | ICD-10-CM | POA: Insufficient documentation

## 2016-06-12 DIAGNOSIS — Z3402 Encounter for supervision of normal first pregnancy, second trimester: Secondary | ICD-10-CM

## 2016-06-12 DIAGNOSIS — Z3689 Encounter for other specified antenatal screening: Secondary | ICD-10-CM | POA: Diagnosis present

## 2016-06-23 ENCOUNTER — Ambulatory Visit (INDEPENDENT_AMBULATORY_CARE_PROVIDER_SITE_OTHER): Payer: BLUE CROSS/BLUE SHIELD | Admitting: Obstetrics & Gynecology

## 2016-06-23 VITALS — BP 89/60 | HR 81 | Wt 112.0 lb

## 2016-06-23 DIAGNOSIS — IMO0001 Reserved for inherently not codable concepts without codable children: Secondary | ICD-10-CM

## 2016-06-23 DIAGNOSIS — O350XX0 Maternal care for (suspected) central nervous system malformation in fetus, not applicable or unspecified: Secondary | ICD-10-CM

## 2016-06-23 DIAGNOSIS — Z3402 Encounter for supervision of normal first pregnancy, second trimester: Secondary | ICD-10-CM

## 2016-06-23 NOTE — Progress Notes (Signed)
[employer] Interpreter # [phone], [phone] work twice  Phone interpreter [phone] OB scheduled for June 11th @ 0830.  Pt notified.    PRENATAL VISIT NOTE  Subjective:  Jacqueline Gates is a 26 y.o. G1P0 at [redacted]w[redacted]d being seen today for ongoing prenatal care.  She is currently monitored for the following issues for this low-risk pregnancy and has Encounter for supervision of low-risk first pregnancy in first trimester and Supervision of normal pregnancy on her problem list.  Patient reports no complaints.  Contractions: Not present. Vag. Bleeding: None.  Movement: Present. Denies leaking of fluid.   The following portions of the patient's history were reviewed and updated as appropriate: allergies, current medications, past family history, past medical history, past social history, past surgical history and problem list. Problem list updated.  Objective:   Vitals:   06/23/16 0857  BP: (!) 89/60  Pulse: 81  Weight: 112 lb (50.8 kg)    Fetal Status: Fetal Heart Rate (bpm): 154   Movement: Present     General:  Alert, oriented and cooperative. Patient is in no acute distress.  Skin: Skin is warm and dry. No rash noted.   Cardiovascular: Normal heart rate noted  Respiratory: Normal respiratory effort, no problems with respiration noted  Abdomen: Soft, gravid, appropriate for gestational age. Pain/Pressure: Present     Pelvic:  Cervical exam deferred        Extremities: Normal range of motion.  Edema: None  Mental Status: Normal mood and affect. Normal behavior. Normal judgment and thought content.   Assessment and Plan:  Pregnancy: G1P0 at [redacted]w[redacted]d  1. Choroid plexus cyst of fetus, single or unspecified fetus Explained with interpreter.  Pt understands 1-2 percent have CP cysts.   - [place] MFM OB FOLLOW UP; Future  2.  Patient wants to breast feed -Lyla Son H to get info in [place] for her   Preterm labor symptoms and general obstetric precautions including but not limited to vaginal  bleeding, contractions, leaking of fluid and fetal movement were reviewed in detail with the patient. Please refer to After Visit Summary for other counseling recommendations.  Return in about 4 weeks (around 07/21/2016).   Lesly Dukes, MD

## 2016-06-23 NOTE — Patient Instructions (Signed)
Back Pain in Pregnancy Back pain during pregnancy is common. Back pain may be caused by several factors that are related to changes during your pregnancy. Follow these instructions at home: Managing pain, stiffness, and swelling   If directed, apply ice for sudden (acute) back pain.  Put ice in a plastic bag.  Place a towel between your skin and the bag.  Leave the ice on for 20 minutes, 2-3 times per day.  If directed, apply heat to the affected area before you exercise:  Place a towel between your skin and the heat pack or heating pad.  Leave the heat on for 20-30 minutes.  Remove the heat if your skin turns bright red. This is especially important if you are unable to feel pain, heat, or cold. You may have a greater risk of getting burned. Activity   Exercise as told by your health care provider. Exercising is the best way to prevent or manage back pain.  Listen to your body when lifting. If lifting hurts, ask for help or bend your knees. This uses your leg muscles instead of your back muscles.  Squat down when picking up something from the floor. Do not bend over.  Only use bed rest as told by your health care provider. Bed rest should only be used for the most severe episodes of back pain. Standing, Sitting, and Lying Down   Do not stand in one place for long periods of time.  Use good posture when sitting. Make sure your head rests over your shoulders and is not hanging forward. Use a pillow on your lower back if necessary.  Try sleeping on your side, preferably the left side, with a pillow or two between your legs. If you are sore after a night's rest, your bed may be too soft. A firm mattress may provide more support for your back during pregnancy. General instructions   Do not wear high heels.  Eat a healthy diet. Try to gain weight within your health care provider's recommendations.  Use a maternity girdle, elastic sling, or back brace as told by your health care  provider.  Take over-the-counter and prescription medicines only as told by your health care provider.  Keep all follow-up visits as told by your health care provider. This is important. This includes any visits with any specialists, such as a physical therapist. Contact a health care provider if:  Your back pain interferes with your daily activities.  You have increasing pain in other parts of your body. Get help right away if:  You develop numbness, tingling, weakness, or problems with the use of your arms or legs.  You develop severe back pain that is not controlled with medicine.  You have a sudden change in bowel or bladder control.  You develop shortness of breath, dizziness, or you faint.  You develop nausea, vomiting, or sweating.  You have back pain that is a rhythmic, cramping pain similar to labor pains. Labor pain is usually 1-2 minutes apart, lasts for about 1 minute, and involves a bearing down feeling or pressure in your pelvis.  You have back pain and your water breaks or you have vaginal bleeding.  You have back pain or numbness that travels down your leg.  Your back pain developed after you fell.  You develop pain on one side of your back.  You see blood in your urine.  You develop skin blisters in the area of your back pain. This information is not intended to replace   advice given to you by your health care provider. Make sure you discuss any questions you have with your health care provider. Document Released: 05/28/2005 Document Revised: 07/26/2015 Document Reviewed: 11/01/2014 Elsevier Interactive Patient Education  2017 Elsevier Inc.  

## 2016-07-21 ENCOUNTER — Ambulatory Visit (INDEPENDENT_AMBULATORY_CARE_PROVIDER_SITE_OTHER): Payer: BLUE CROSS/BLUE SHIELD | Admitting: Obstetrics and Gynecology

## 2016-07-21 VITALS — BP 91/57 | HR 85 | Wt 115.0 lb

## 2016-07-21 DIAGNOSIS — Z3402 Encounter for supervision of normal first pregnancy, second trimester: Secondary | ICD-10-CM

## 2016-07-21 DIAGNOSIS — Z758 Other problems related to medical facilities and other health care: Secondary | ICD-10-CM | POA: Insufficient documentation

## 2016-07-21 DIAGNOSIS — Z789 Other specified health status: Secondary | ICD-10-CM

## 2016-07-21 NOTE — Progress Notes (Signed)
Prenatal Visit Note Date: 07/21/2016 [facility]  Subjective:  Jacqueline Gates is a 26 y.o. G1P0 at 8834w4d being seen today for ongoing prenatal care.  She is currently monitored for the following issues for this low-risk pregnancy and has Supervision of normal pregnancy and Language barrier on her problem list.  Patient reports no complaints.   Contractions: Not present. Vag. Bleeding: None.  Movement: Present. Denies leaking of fluid.   The following portions of the patient's history were reviewed and updated as appropriate: allergies, current medications, past family history, past medical history, past social history, past surgical history and problem list. Problem list updated.  Objective:   Vitals:   07/21/16 0800  BP: (!) 91/57  Pulse: 85  Weight: 52.2 kg (115 lb)    Fetal Status: Fetal Heart Rate (bpm): 150s Fundal Height: 25 cm Movement: Present     General:  Alert, oriented and cooperative. Patient is in no acute distress.  Skin: Skin is warm and dry. No rash noted.   Cardiovascular: Normal heart rate noted  Respiratory: Normal respiratory effort, no problems with respiration noted  Abdomen: Soft, gravid, appropriate for gestational age. Pain/Pressure: Absent     Pelvic:  Cervical exam deferred        Extremities: Normal range of motion.  Edema: None  Mental Status: Normal mood and affect. Normal behavior. Normal judgment and thought content.   Urinalysis:      Assessment and Plan:  Pregnancy: G1P0 at 4534w4d  1. Encounter for supervision of normal first pregnancy in second trimester 28wk labs nv. Has rpt u/s already scheduled to follow up CP cysts; s/p neg 1st trimester u/s and afp already. Anatomy u/s neg aside from that.   2. Language barrier Interpreter used  Preterm labor symptoms and general obstetric precautions including but not limited to vaginal bleeding, contractions, leaking of fluid and fetal movement were reviewed in detail with  the patient. Please refer to After Visit Summary for other counseling recommendations.  Return in about 2 weeks (around 08/04/2016) for rob and 2hr GTT.   Leeds BingPickens, Keil Pickering, MD

## 2016-07-21 NOTE — Progress Notes (Signed)
[place] interpreter # [phone] used for visit Pt c/o insomnia

## 2016-08-06 ENCOUNTER — Ambulatory Visit (INDEPENDENT_AMBULATORY_CARE_PROVIDER_SITE_OTHER): Payer: BLUE CROSS/BLUE SHIELD | Admitting: Family Medicine

## 2016-08-06 VITALS — BP 90/63 | HR 87 | Wt 117.0 lb

## 2016-08-06 DIAGNOSIS — Z789 Other specified health status: Secondary | ICD-10-CM

## 2016-08-06 DIAGNOSIS — Z23 Encounter for immunization: Secondary | ICD-10-CM

## 2016-08-06 DIAGNOSIS — Z3402 Encounter for supervision of normal first pregnancy, second trimester: Secondary | ICD-10-CM

## 2016-08-06 NOTE — Patient Instructions (Addendum)
AREA PEDIATRIC/FAMILY PRACTICE PHYSICIANS   [facility] [address], [address] [place], [place]  [address] Phone - [phone]   Fax - [phone]  [facility] [address] [place], [place] [address] Phone - [phone]   Fax - [phone]  JACK AMOS [address] [place], [place]  [address] Phone - [phone]   Fax - [phone]  [facility] [address] [place], [place]  [address] Phone - [phone]   Fax - [phone]  [facility] [address] [place], [place]  [address] Phone - [phone]   Fax - [phone]  [facility] [address], [address] [place], [place]  [address] Phone - [phone]   Fax - [phone]  [facility] [address], [address] [place], [place]  [address] Phone - [phone]   Fax - [phone]  [facility] [address], [address] [place], [place]  [address] Phone - [phone]   Fax - [phone]  [facility] [address] [place], [place]  [address] Phone - [phone]   Fax - [phone] [facility] [place] [address] [place], [place]  [address] Phone - [phone]   Fax - [phone]  [facility] [address] [place], [place]  [address] Phone - [phone]   Fax - [phone]  [facility] [address], [address] [place], [place]  [address] Phone - [phone]   Fax - [phone]  [facility] [address], [address] [place], [place]  [address] Phone - [phone]   Fax - [phone]  [facility] [address], [address] [place], [place]  [address] Phone - [phone]  [facility] [address] [place], [place]  [address] Phone - [phone]   Fax - [phone]  [facility] [address], [address] [place], [place]  [address] Phone - [phone]   Fax - [phone]  [facility] [address] [place], [place]  [address] Phone - [phone]   Fax - [phone]  [facility] [address] [place], [place]  [address] Phone - [phone]   Fax - [phone]   [facility] [address], [address] [place], [place]  [address] Phone - [phone]   Fax - [phone]  [facility] [address], [address] [place], [place]  [address] Phone - [phone]   Fax - [phone]  DAVID RUBIN [address], [address] [place], [place]  [address] Phone - [phone]   Fax - [phone]  [facility] [address], [address] [place], [place]  [address] Phone - [phone]   Fax - [phone]  [facility] [address] [place], [place]  [address] Phone - [phone]   Fax - [phone] [facility] [place] [address] [place], [place]  [address] Phone - [phone]   Fax - [phone]  [facility] [place] [address] [place], [place]  [address] Phone - [phone]   Fax - [phone]  [facility] [place] [address], [address] [place], [place]  [address] Phone - [phone]   Fax - [phone]  [facility] [place] Wyvonne Lenzharlene Flemming MD [address] [place] [address] Phone [phone]  Fax [phone]     Places to have your son circumcised:    [facility] [phone] $  [phone] by 4 wks  [facility] [phone] $[phone] by 4 wks  [facility] [phone] $[phone] by 2 wks  [facility] [phone] $[phone] by 7 days [facility] [phone] $[phone] by 4 wks  These prices sometimes change but are roughly what you can expect to pay. Please call  and confirm pricing.   Circumcision is considered an elective/non-medically necessary procedure. There are many reasons parents decide to have their sons circumsized. During the first year of life circumcised males have a reduced risk of urinary tract infections but after this year the rates between circumcised males and uncircumcised males are the same.  It is safe to have your son circumcised outside of the hospital and the places above perform them regularly.   Ba thng th? th? ba c?a [place] k? (Third Trimester of Pregnancy) Ba thng th? ba l t? tu?n 29 ??n tu?n 40, thng th? 7 ??n thng th? 9. Ba thng th? ba c?ng l th?i gian khi bo thai ?ang pht tri?n nhanh chng. Vo cu?i thng th? chn, bo thai di kho?ng 20 inch v n?ng kho?ng 6-10 pao. C? TH? THAY ??I C? th? c?a qu v? tr?i qua r?t nhi?u thay ??i trong th?i gian mang [place]. Nh?ng thay ??i khc nhau ? cc ph? n? khc nhau. Cn n?ng c?a qu v? s? ti?p t?c t?ng. Qu v? c th? t?ng ???c 25-35 pao (11 - 16 kg) vo cu?i [place] k?Ladell Heads v? c th? b?t ??u c cc v?t n?t trn hng, b?ng v ng?c. Qu v? c th? ?i ti?u th??ng xuyn h?n v bo thai di chuy?n xu?ng th?p h?n vo khung ch?u c?a qu v? v ? vo bng quang c?a qu v?. Qu v? c th? b? ho?c ti?p t?c b? ? nng do vi?c mang [place]. Qu v? c th? b? to bn v m?t s? hoc mn nh?t ??nh ?ang lm cc c? c ch?c n?ng ??y ch?t th?i qua ???ng ru?t ho?t ??ng ch?m l?i. Qu v? c th? b? b?nh tr? ho?c s?ng, ph?ng t?nh m?ch (gin t?nh m?ch). Qu v? c th? ?au khung ch?u v t?ng cn v cc hoc mn mang thai lm l?ng kh?p n?i gi?a cc x??ng ? khung ch?u c?a qu v?. ?au l?ng c th? do s? c? g?ng qu s?c c?a c? b?p h? tr? t? th? c?a qu v?. Qu v? c nh?ng thay ??i v? tc. Nh?ng thay ??i ny c th? bao g?m tc dy ln, tc m?c nhanh h?n v thay ??i v? k?t c?u. M?t s? ph? n? c?ng b? r?ng tc trong khi ho?c sau khi mang thai, ho?c tc c?m th?y kh ho?c m?ng. Tc c?a qu v? s? c nhi?u kh? n?ng s? tr? l?i bnh  th??ng sau khi em b ???c sinh ra. Ng?c qu v? s? ti?p t?c pht tri?n v nh?y c?m ?au. Ch?t d?ch mu vng c th? r? t? v c?a qu v? g?i l s?a non. R?n c?a qu v? c th? l?i ra. Qu v? c th? c?m th?y kh th? v t? cung c?a qu v? to ra. Qu v? c th? nh?n th?y [place] nhi "r?i xu?ng", ho?c di chuy?n xu?ng th?p h?n trong b?ng c?a qu v?. Qu v? c th? b? ti?t d?ch nh?y c mu. ?i?u ny th??ng x?y ra m?t vi ngy ??n m?t tu?n tr??c khi b?t ??u sinh ??. C? t? cung c?a qu v? tr? nn m?ng v m?m (b? m?ng c? t? cung) g?n ngy d? sinh c?a qu v?. TRNG ??I ?I?U G TRONG  NH?NG L?N KHM TR??C KHI SINH Qu v? s? ???c khm tr??c khi sinh 2 tu?n m?t l?n cho ??n tu?n 34. Sau ?, qu v? s? ???c khm hng tu?n tr??c khi sinh. Trong m?t l?n khm tr??c khi sinh: Qu v? s? ???c cn n?ng ?? ??m b?o qu v? v [place] nhi ?ang pht tri?n bnh th??ng. Qu v? ???c ?o huy?t p. Qu v? s? ???c ?o b?ng ?? theo di s? pht tri?n c?a b. S? nghe th?y nh?p tim [place]. B?t k? k?t qu? ki?m tra no trong l?n khm tr??c ? s? ???c th?o lu?n. Qu v? c th? ???c ki?m tra c? t? cung g?n ngy d? sinh ?? xem c? t? cung ? m?ng ch?a. Lc ???c kho?ng 34 tu?n, chuyn gia ch?m [place] y t? c?a qu v? s? ki?m tra c? t? cung c?a qu v?. Cng lc ?, chuyn gia ch?m [place] y t? c?ng s? th?c hi?n ki?m tra ch?t ti?t ra c?a m m ??o. Ki?m tra ny l ?? xc ??nh xem c m?t lo?i vi khu?n, lin c?u nhm B, hay khng. Chuyn gia ch?m [place] y t? c?a qu v? s? gi?i thch thm. Chuyn gia ch?m [place] y t? c th? h?i qu v?: K? ho?ch sinh c?a qu v? l g. Qu v? c?m th?y th? no. Qu v? c c?m th?y em b c? ??ng. Li?u qu v? c b?t k? tri?u ch?ng b?t th??ng no, ch?ng h?n nh? r r? ch?t l?ng, ch?y mu, ?au ??u nghim tr?ng ho?c co th?t ? b?ng. Li?u qu v? c ?ang s? d?ng b?t k? s?n ph?m thu?c l no, bao g?m thu?c l d?ng ht, thu?c l d?ng nhai v thu?c l ?i?n t?. Li?u qu v? c b?t k? cu h?i no khng. Nh?ng ki?m tra ho?c sng l?c khc c th? ???c th?c hi?n  trong k? ba thng th? hai c?a qu v? bao g?m: Cc xt nghi?m mu ki?m tra n?ng ?? s?t th?p (thi?u mu). Th? thai ?? ki?m tra s?c kh?e, m?c ?? ho?t ??ng v s? pht tri?n c?a [place] nhi. Th? nghi?m ???c th?c hi?n n?u qu v? c m?t s? tnh tr?ng b?nh l, ho?c n?u c nh?ng v?n ?? trong qu trnh mang thai. Xt nghi?m HIV (Vi rt suy gi?m mi?n d?ch ? ng??i). N?u qu v? c nguy c? cao, qu v? c th? ???c ki?m tra HIV trong ba thng th? ba c?a k? mang [place]. CHUY?N D? GI? Qu v? c th? c?m th?y co th?t nh?, khng ??u m d?n d?n s? h?t. Chng ???c g?i l c?n co th?t Braxton Hicks, hay chuy?n d? gi?Marland Kitchen Co th?t c th? ko di trong nhi?u gi?, nhi?u ngy, ho?c th?m ch nhi?u tu?n tr??c khi chuy?n d? th?t. N?u c?n co th?t ??u ??n, t?ng c??ng, ho?c tr? nn ?au ??n, t?t nh?t l ph?i ??n g?p chuyn gia ch?m [place] y t? ?? khm. CC D?U HI?U TR? D? Co th?t gi?ng nh? khi c kinh nguy?t. Cc c?n co th?t cch nhau 5 pht ho?c nhanh h?n. Co th?t b?t ??u t? ??nh t? cung v lan xu?ng b?ng d??i v l?ng. C?m gic p l?c ln khung ch?u t?ng ln ho?c ?au l?ng. Ti?t d?ch nh?y c n??c ho?c mu t? m ??o. N?u qu v? c b?t k? nh?ng d?u hi?u ny tr??c tu?n 37 c?a [place] k?, g?i cho chuyn gia ch?m [place] y t? ngay l?p t?c. Qu v? s? c?n ??n b?nh vi?n ?? ???c ki?m tra ngay l?p t?c.  H??NG D?N CH?M [place] T?I NH Trnh t?t c? thu?c l, th?o d??c, r??u v ma ty khng ???c k ??n. Cc ha ch?t ny ?nh h??ng ??n s? hnh thnh v pht tri?n c?a em b. Khng s? d?ng b?t c? cc s?n ph?m thu?c l no, bao g?m thu?c l d?ng ht, thu?c l d?ng nhai v thu?c l ?i?n t?. N?u qu v? c?n gip ?? ?? cai thu?c, hy h?i chuyn gia ch?m [place] s?c kh?e. Qu v? c th? nh?n ???c h? tr? t? v?n v cc ngu?n h? tr? khc ?? gip qu v? b? thu?c l. Tun th? cc ch? d?n c?a chuyn gia ch?m [place] y t? v? vi?c s? d?ng thu?c. C nh?ng lo?i thu?c l an ton ho?c khng an ton trong qu trnh [place]. T?p th? d?c th??ng xuyn theo ch? d?n c?a chuyn gia ch?m [place] y t?. B? co  th?t t? cung l m?t d?u hi?u t?t ?? ng?ng t?p th? d?c. Ti?p t?c ?n cc b?a ?n th??ng xuyn, lnh m?nh. M?c m?t chi?c o ng?c nng ?? t?t cho v b? nh?y c?m ?au. Khng s? d?ng b?n t?m n??c nng, phng xng h?i, ho?c phng t?m h?i. ?eo dy an ton c?a qu v? m?i lc khi li xe. [place] th?t s?ng, ph mai ch?a n?u chn, h?p v? sinh c?a mo, v ??t v? sinh dnh cho mo. Nh?ng th? ny mang m?m b?nh c th? gy d? t?t b?m sinh cho em b. U?ng vitamin tr??c khi sinh c?a qu v?. U?ng 1500-2000 mg canxi m?i ngy b?t ??u t? tu?n th? 20 c?a [place] k? cho ??n khi sinh con. Hy th? dng thu?c lm m?m phn (n?u chuyn gia ch?m [place] y t? c?a qu v? ch?p thu?n) n?u qu v? b? to bn. ?n thm th?c ?n giu ch?t x?, nh? rau t??i ho?c tri cy v ng? c?c nguyn h?t. U?ng nhi?u n??c ?? gi? cho n??c ti?u trong ho?c vng nh?t. T?m b?n ng?i ?? lm d?u c?n ?au ho?c s? kh ch?u do b?nh tr? gy ra. S? d?ng kem tr? n?u chuyn gia ch?m [place] y t? ch?p thu?n. N?u qu v? b? gin t?nh m?ch, hy ?eo t?t h? tr?. Nng cao chn c?a qu v? trong 15 pht, 3-4 l?n m?t ngy. H?n ch? mu?i trong ch? ?? ?n c?a qu v?. Trnh nng v?t n?ng, hay ?i giy th?p gt, v luy?n t?p t? th? ph h?p. Ngh? ng?i nhi?u v?i ?i chn c?a qu v? nng cao n?u qu v? b? chu?t rt chn ho?c ?au vng th?t l?ng. ??n khm nha s? n?u qu v? ch?a ??n trong th?i gian qu v? mang [place]. S? d?ng m?t bn ch?i m?m ?? ch?i r?ng c?a qu v? v hy nh? nhng khi dng ch? nha khoa. C th? ???c ti?p t?c c quan h? tnh d?c tr? khi chuyn gia ch?m [place] y t? yu c?u khc. Khng ?i du l?ch xa tr? khi n l hon ton c?n thi?t v ch? ?i v?i s? ch?p thu?n c?a chuyn gia ch?m [place] y t?. Tham gia cc l?p h?c tr??c khi sinh ?? hi?u, th?c hnh, v ??t cu h?i v? tr? d? v sinh con. Th?c hi?n m?t l?n th? t?i b?nh vi?n. Chu?n b? ti ?? ?? ?i vi?n. Chu?n b? phng cho em b. Ti?p t?c ??n m?i l?n h?n khm tr??c khi sinh theo ch? d?n c?a chuyn gia ch?m [place] y t? c?a qu v?.  ?I KHM N?U: Qu v?  khng ch?c  ch?n li?u qu v? c tr? d? ho?c li?u qu v? ? v? ?i hay ch?a. Qu v? b? hoa m?t. Qu v? b? co th?t vng ch?u, p l?c ln vng ch?u, ?au m ? ? vng b?ng c?a qu v?. Qu v? b? bu?n nn, nn m?a ho?c tiu ch?y lin t?c. Qu v? c d?ch m ??o mi kh ch?u. Qu v? b? ?au khi ?i ti?u.  NGAY L?P T?C ?I KHM N?U: Qu v? b? s?t. Qu v? b? r r? d?ch t? m ??o. Qu v? c ra ??m mu ho?c ch?y mu t? m ??o. Qu v? b? co th?t nghim tr?ng ho?c ?au ? b?ng. Qu v? gi?m cn ho?c t?ng cn nhanh. Qu v? b? kh th? cng v?i ?au ng?c. Qu v? th?y s?ng b?t ng? ho?c r?t to ? m?t, tay, m?t c chn, bn chn ho?c c?ng chn. Qu v? khng th?y em b c? ??ng trong h?n m?t gi?Ladell Heads v? b? ?au ??u nghim tr?ng m khng h?t sau khi dng thu?c. Qu v? b? thay ??i th? l?c.  Thng tin ny khng nh?m m?c ?ch thay th? cho l?i khuyn m chuyn gia ch?m [place] s?c kh?e ni v?i qu v?. Hy b?o ??m qu v? ph?i th?o lu?n b?t k? v?n ?? g m qu v? c v?i chuyn gia ch?m [place] s?c kh?e c?a qu v?. Document Released: 03/16/2015 Document Revised: 03/16/2015 Document Reviewed: 04/20/2012 Elsevier Interactive Patient Education  2017 Elsevier Inc.   Cc bi?n php trnh [place] (Contraception Choices) [place] thai (ki?m sot sinh s?n) l vi?c s? d?ng b?t k? ph??ng php ho?c d?ng c? no ?? trnh [place]. D??i ?y l m?t s? ph??ng php trnh [place]. PH??NG PHP HOC MN C?y ghp trnh [place]. ?y l m?t ?ng m?ng b?ng nh?a ch?a hoc mn progesterone. N khng ch?a hoc mn estrogen. Chuyn gia ch?m [place] s?c kh?e c?a qu v? s? lu?n ?ng vo ph?n bn trong c?a cnh tay pha trn. ?ng c th? v?n ? ? cho ??n 3 n?m. Sau 3 n?m, ?ng c?y ghp ph?i ???c tho ra. ?ng c?y ghp ng?n khng cho bu?ng tr?ng nh? tr?ng ra (r?ng tr?ng), lm ??c ch?t nh?y ? c? t? cung ?? ng?n khng cho tinh trng xm nh?p vo t? cung, v lm m?ng nim m?c bn trong t? cung. Tim ch? ring progesterone. Chuyn gia ch?m [place] s?c kh?e s? tim cho qu v? 3 thng m?t l?n ?? trnh  [place]. Hoc mn progesterone t?ng h?p ny d?ng vi?c r?ng tr?ng t? bu?ng tr?ng. N c?ng lm ??c ch?t nh?y c? t? cung v thay ??i n?i m?c t? cung. ?i?u ny lm cho tinh trng kh t?n t?i h?n trong t? cung. Vin thu?c trnh [place]. Nh?ng vin thu?c ny ch?a hoc mn estrogen v progesterone. Chng ho?t ??ng b?ng cch ng?n khng cho bu?ng tr?ng gi?i phng tr?ng (r?ng tr?ng). Chng c?ng lm ch?t nh?y ? c? t? cung ??c l?i, t? ? ng?n c?n tinh trng xm nh?p vo t? cung. Thu?c trnh [place] ???c chuyn gia ch?m [place] s?c kh?e k ??n.Thu?c trnh [place] c?ng c th? ???c dng ?? ?i?u tr? rong kinh. Vin thu?c minipil. Lo?i vin trnh [place] ny ch? ch?a hoc mn progesterone. Chng ???c dng hng ngy c?a m?i thng v ph?i ???c chuyn gia ch?m [place] s?c kh?e c?a qu v? k ??n. Mi?ng dn trnh [place]. Mi?ng dn c ch?a hoc mn t??ng t? nh? trong vin thu?c trnh [place]. N ph?i ???c thay ??i m?t l?n m?t tu?n v  ph?i do chuyn gia ch?m [place] s?c kh?e k ??n. Vng ??t m ??o. Vng ??t c ch?a hoc mn t??ng t? nh? trong vin thu?c trnh [place]. N n?m l?i trong m ??o trong 3 tu?n, ???c tho ra trong 1 tu?n, v sau ? m?t vng m?i ???c ??t l?i vo ch? c?. B?nh nhn ph?i th?y tho?i mi khi ??t v tho vng t? m ??o.C?n ph?i c ??n thu?c c?a m?t chuyn gia ch?m [place] s?c kh?e. [place] thai kh?n c?p. Thu?c trnh thai kh?n c?p ng?n ng?a vi?c mang thai sau khi quan h? tnh d?c khng c bi?n php b?o v?. Vin thu?c ny c th? u?ng ngay sau khi quan h? tnh d?c ho?c ??n 5 ngy sau khi quan h? tnh d?c khng c bi?n php b?o v?. Thu?c ny hi?u qu? nh?t khi qu v? dng thu?c s?m sau khi quan h? tnh d?c. H?u h?t cc vin thu?c trnh [place] kh?n c?p c bn m khng c?n k ??n. Ki?m tra v?i d??c s? c?a qu v?. Khng s? d?ng bi?n php trnh [place] kh?n c?p nh? l hnh th?c duy nh?t ?? ki?m sot sinh ??.  PH??NG PHP MNG NG?N Bao cao su cho nam. ?y l m?t bao m?ng (latex hay cao su) ???c ?eo vo d??ng v?t trong khi giao h?p. N c th? ???c s? d?ng v?i ch?t  di?t tinh trng ?? t?ng hi?u qu?[place]err[place] su cho n?. ?y l m?t mng m?m, r?ng ???c ??a vo m ??o tr??c khi giao h?p. Mng ch?n. ?y l m?t mng ch?n m?m b?ng latex, c hnh vm m ph?i ???c m?t chuyn gia ch?m [place] s?c kh?e ??t. N ???c ??t vo m ??o, cng v?i m?t ch?t th?ch ?? di?t tinh trng. N ???c ??t vo tr??c khi giao h?p. Mng ch?n nn ?? trong m ??o trong 6-8 gi? sau khi giao h?p. M? c? t? cung. ?y l m?t c?c hnh trn, m?m b?ng latex ??t ln c? t? cung v ph?i ???c m?t chuyn gia ch?m  s?c kh?e ??t. M? ny c th? ???c gi? ? t?i ch? cho ??n 48 gi? sau khi giao h?p. Mi?ng x?p. ?y l m?t mi?ng x?p polyurethane m?m. Mi?ng x?p c ch?t di?t tinh trng trong ?. N ???c ??a vo m ??o sau khi lm ??t v tr??c khi giao h?p. Ch?t di?t tinh trng. ?y l nh?ng ha ch?t di?t ho?c ng?n khng cho tinh trng xm nh?p vo c? t? cung v t? cung. Chng c d??i d?ng kem, th?ch, thu?c ??n, b?t, ho?c vin. Nh?ng ch?t ny khng c?n k ??n thu?c. Chng ???c ??a vo m ??o b?ng m?t d?ng c? bi tr??c khi giao h?p. Qu trnh ny ph?i ???c l?p l?i m?i khi qu v? giao h?p.  TRNH THAI TRONG T? CUNG Vng trnh [place] (IUD). ?y l m?t d?ng c? hnh ch? T ???c ??t vo t? cung c?a ng??i ph? n? trong k? kinh nguy?t ?? trnh [place]. C 2 lo?i vng trnh thai: IUD b?ng ??ng. ?y l lo?i IUD ???c b?c trong dy ??ng v ???c ??t bn trong t? cung. ??ng lm cho t? cung v ?ng d?n tr?ng s?n xu?t ra m?t ch?t l?ng tiu di?t tinh trng. N c th? ? l?i t?i ch? trong 10 n?m. IUD hoc mn. ?y l lo?i IUD ch?a progestin hoc mn (progesterone t?ng h?p). Hoc mn s? lm ??c ch?t nh?y c? t? cung v ng?n khng cho tinh trng xm nh?p vo t? cung, v n c?ng lm  m?ng nim m?c t? cung ?? ng?n ch?n vi?c c?y tr?ng ? th? tinh. Hoc mn c th? lm suy y?u ho?c tiu di?t tinh trng xm nh?p vo t? cung. N c th? ? l?i t?i ch? trong 3-5 n?m, ty thu?c vo lo?i vng trnh [place] ???c s? d?ng.  CC PH??NG PHP TRNH THAI V?NH VI?N Th?t ?ng d?n  tr?ng ? n?. ? l khi ?ng d?n tr?ng c?a ng??i ph? n? ???c b?t, th?t, ho?c ?ng l?i b?ng ph?u thu?t ?? ng?n khng cho tr?ng ?i vo t? cung. Tri?t s?n b?ng n?i soi t? cung. Bi?n php ny bao g?m vi?c ??t m?t cu?n dy nh? ho?c lu?n vo t?ng ?ng d?n tr?ng. Bc s? c?a qu v? s? d?ng m?t k? thu?t g?i l n?i soi t? cung ?? lm th? thu?t ny. D?ng c? ny lm hnh thnh m s?o. ?i?u ny d?n ??n t?c ?ng d?n tr?ng v?nh vi?n, do ? tinh trng khng th? th? tinh cho tr?ng. C?n kho?ng 3 thng sau khi lm th? thu?t ?? cho cc ?ng d?n tr?ng b? t?c. Qu v? ph?i s? d?ng m?t hnh th?c trnh [place] khc trong 3 thng ny. Tri?t s?n nam. ? l khi nam gi?i th?t cc ?ng d?n tinh (th?t ?ng d?n tinh).Th? thu?t ny ng?n khng cho tinh trng vo m ??o khi giao h?p. Sau khi lm th? thu?t, nam gi?i v?n c th? xu?t ra ch?t l?ng (tinh d?ch).  CC PH??NG PHP K? HO?CH T? NHIN K? ho?ch ha gia ?nh t? nhin. ? l khng c giao h?p ho?c s? d?ng m?t ph??ng php mng ch?n (bao cao su, mng ng?n, m? c? t? cung) vo nh?ng ngy ng??i ph? n? c th? mang [place]. Ph??ng php theo l?ch. ?y l vi?c theo di ?? di c?a m?i chu k? kinh nguy?t v xc ??nh khi no qu v? c th? mang [place]. Ph??ng php r?ng tr?ng. ?y l vi?c trnh giao h?p trong th?i gian r?ng tr?ng. Ph??ng php tri?u ch?ng - nhi?t (Symptothermal). ?y l vi?c trnh giao h?p trong th?i gian r?ng tr?ng, s? d?ng m?t nhi?t k? v cc tri?u ch?ng c?a r?ng tr?ng. Ph??ng php sau r?ng tr?ng. ?y l vi?c ??nh th?i gian giao h?p sau khi qu v? ? r?ng tr?ng. B?t k? lo?i ho?c bi?n php trnh [place] m qu v? ch?n, ?i?u quan tr?ng l qu v? ph?i s? d?ng bao cao su ?? b?o v? ch?ng l?i s? ly nhi?m c?a cc b?nh qua ???ng tnh d?c (STI). Ni chuy?n v?i chuyn gia ch?m [place] s?c kh?e c?a qu v? v? ph??ng php trnh [place] thch h?p nh?t cho qu v?. Thng tin ny khng nh?m m?c ?ch thay th? cho l?i khuyn m chuyn gia ch?m [place] s?c kh?e ni v?i qu v?. Hy b?o ??m qu v? ph?i th?o lu?n b?t k? v?n ?? g m  qu v? c v?i chuyn gia ch?m [place] s?c kh?e c?a qu v?. Document Released: 03/16/2015 Document Revised: 03/16/2015 Document Reviewed: 08/12/2012 Elsevier Interactive Patient Education  2017 [employer].

## 2016-08-06 NOTE — Progress Notes (Addendum)
      Subjective:  Jacqueline Gates is a 26 y.o. G1P0 at 5114w6d being seen today for ongoing prenatal care.  She is currently monitored for the following issues for this low-risk pregnancy and has Supervision of normal pregnancy and Language barrier on her problem list.  Patient reports no complaints.  Contractions: Not present. Vag. Bleeding: None.  Movement: Present. Denies leaking of fluid.   The following portions of the patient's history were reviewed and updated as appropriate: allergies, current medications, past family history, past medical history, past social history, past surgical history and problem list. Problem list updated.  Objective:   Vitals:   08/06/16 0757  BP: 90/63  Pulse: 87  Weight: 117 lb (53.1 kg)    Fetal Status: Fetal Heart Rate (bpm):  157 Fundal Height: 27 cm Movement: Present      General:  Alert, oriented and cooperative. Patient is in no acute distress.  Skin: Skin is warm and dry. No rash noted.   Cardiovascular: Normal heart rate noted  Respiratory: Normal respiratory effort, no problems with respiration noted  Abdomen: Soft, gravid, appropriate for gestational age. Pain/Pressure: Present     Pelvic:  Cervical exam deferred        Extremities: Normal range of motion.  Edema: None  Mental Status: Normal mood and affect. Normal behavior. Normal judgment and thought content.   Urinalysis:      Assessment and Plan:  Pregnancy: G1P0 at 6114w6d  1. Encounter for supervision of normal first pregnancy in second trimester - CBC - RPR - HIV antibody (with reflex) - Glucose Tolerance, 2 Hours w/1 Hour - Tdap vaccine greater than or equal to 7yo IM  2. Language barrier - [place] interpretor used for visit today   MOC: Condoms, reviewed all forms of contraception, they will think about it MOF: Bottlefeeding Preterm labor symptoms and general obstetric precautions including but not limited to vaginal bleeding, contractions, leaking of fluid and fetal  movement were reviewed in detail with the patient. Please refer to After Visit Summary for other counseling recommendations.  Return in about 4 weeks (around 09/03/2016) for Routine OB visit.   Jen MowElizabeth Mumaw, DO [facility] Fellow [facility]

## 2016-08-07 LAB — CBC
HEMATOCRIT: 33.4 % — AB (ref 34.0–46.6)
HEMOGLOBIN: 11.1 g/dL (ref 11.1–15.9)
MCH: 27.4 pg (ref 26.6–33.0)
MCHC: 33.2 g/dL (ref 31.5–35.7)
MCV: 83 fL (ref 79–97)
Platelets: 219 10*3/uL (ref 150–379)
RBC: 4.05 x10E6/uL (ref 3.77–5.28)
RDW: 14.7 % (ref 12.3–15.4)
WBC: 8 10*3/uL (ref 3.4–10.8)

## 2016-08-07 LAB — GLUCOSE TOLERANCE, 2 HOURS W/ 1HR
GLUCOSE, 2 HOUR: 102 mg/dL (ref 65–152)
Glucose, 1 hour: 120 mg/dL (ref 65–179)
Glucose, Fasting: 75 mg/dL (ref 65–91)

## 2016-08-07 LAB — RPR: RPR Ser Ql: NONREACTIVE

## 2016-08-07 LAB — HIV ANTIBODY (ROUTINE TESTING W REFLEX): HIV SCREEN 4TH GENERATION: NONREACTIVE

## 2016-08-11 ENCOUNTER — Ambulatory Visit (HOSPITAL_COMMUNITY)
Admission: RE | Admit: 2016-08-11 | Discharge: 2016-08-11 | Disposition: A | Discharge status: Home / Self Care | Payer: BLUE CROSS/BLUE SHIELD | Source: Ambulatory Visit | Attending: Obstetrics & Gynecology | Admitting: Obstetrics & Gynecology

## 2016-08-11 ENCOUNTER — Encounter (HOSPITAL_COMMUNITY): Payer: Self-pay

## 2016-08-11 DIAGNOSIS — O350XX0 Maternal care for (suspected) central nervous system malformation in fetus, not applicable or unspecified: Secondary | ICD-10-CM | POA: Diagnosis present

## 2016-08-11 DIAGNOSIS — IMO0001 Reserved for inherently not codable concepts without codable children: Secondary | ICD-10-CM

## 2016-08-11 DIAGNOSIS — Z362 Encounter for other antenatal screening follow-up: Secondary | ICD-10-CM | POA: Diagnosis present

## 2016-08-11 DIAGNOSIS — Z3A28 28 weeks gestation of pregnancy: Secondary | ICD-10-CM | POA: Diagnosis not present

## 2016-08-11 DIAGNOSIS — O09893 Supervision of other high risk pregnancies, third trimester: Secondary | ICD-10-CM | POA: Insufficient documentation

## 2016-08-18 ENCOUNTER — Encounter: Payer: BLUE CROSS/BLUE SHIELD | Admitting: Obstetrics and Gynecology

## 2016-09-02 ENCOUNTER — Ambulatory Visit (INDEPENDENT_AMBULATORY_CARE_PROVIDER_SITE_OTHER): Payer: BLUE CROSS/BLUE SHIELD | Admitting: Medical

## 2016-09-02 VITALS — BP 88/62 | HR 81 | Wt 121.7 lb

## 2016-09-02 DIAGNOSIS — Z3403 Encounter for supervision of normal first pregnancy, third trimester: Secondary | ICD-10-CM

## 2016-09-02 DIAGNOSIS — Z3402 Encounter for supervision of normal first pregnancy, second trimester: Secondary | ICD-10-CM

## 2016-09-02 NOTE — Patient Instructions (Signed)
Fetal Movement Counts °Patient Name: ________________________________________________ Patient Due Date: ____________________ °What is a fetal movement count? °A fetal movement count is the number of times that you feel your baby move during a certain amount of time. This may also be called a fetal kick count. A fetal movement count is recommended for every pregnant woman. You may be asked to start counting fetal movements as early as week 28 of your pregnancy. °Pay attention to when your baby is most active. You may notice your baby's sleep and wake cycles. You may also notice things that make your baby move more. You should do a fetal movement count: °· When your baby is normally most active. °· At the same time each day. ° °A good time to count movements is while you are resting, after having something to eat and drink. °How do I count fetal movements? °1. Find a quiet, comfortable area. Sit, or lie down on your side. °2. Write down the date, the start time and stop time, and the number of movements that you felt between those two times. Take this information with you to your health care visits. °3. For 2 hours, count kicks, flutters, swishes, rolls, and jabs. You should feel at least 10 movements during 2 hours. °4. You may stop counting after you have felt 10 movements. °5. If you do not feel 10 movements in 2 hours, have something to eat and drink. Then, keep resting and counting for 1 hour. If you feel at least 4 movements during that hour, you may stop counting. °Contact a health care provider if: °· You feel fewer than 4 movements in 2 hours. °· Your baby is not moving like he or she usually does. °Date: ____________ Start time: ____________ Stop time: ____________ Movements: ____________ °Date: ____________ Start time: ____________ Stop time: ____________ Movements: ____________ °Date: ____________ Start time: ____________ Stop time: ____________ Movements: ____________ °Date: ____________ Start time:  ____________ Stop time: ____________ Movements: ____________ °Date: ____________ Start time: ____________ Stop time: ____________ Movements: ____________ °Date: ____________ Start time: ____________ Stop time: ____________ Movements: ____________ °Date: ____________ Start time: ____________ Stop time: ____________ Movements: ____________ °Date: ____________ Start time: ____________ Stop time: ____________ Movements: ____________ °Date: ____________ Start time: ____________ Stop time: ____________ Movements: ____________ °This information is not intended to replace advice given to you by your health care provider. Make sure you discuss any questions you have with your health care provider. °Document Released: 03/19/2006 Document Revised: 10/17/2015 Document Reviewed: 03/29/2015 °Elsevier Interactive Patient Education © 2018 Elsevier Inc. °Braxton Hicks Contractions °Contractions of the uterus can occur throughout pregnancy, but they are not always a sign that you are in labor. You may have practice contractions called Braxton Hicks contractions. These false labor contractions are sometimes confused with true labor. °What are Braxton Hicks contractions? °Braxton Hicks contractions are tightening movements that occur in the muscles of the uterus before labor. Unlike true labor contractions, these contractions do not result in opening (dilation) and thinning of the cervix. Toward the end of pregnancy (32-34 weeks), Braxton Hicks contractions can happen more often and may become stronger. These contractions are sometimes difficult to tell apart from true labor because they can be very uncomfortable. You should not feel embarrassed if you go to the hospital with false labor. °Sometimes, the only way to tell if you are in true labor is for your health care provider to look for changes in the cervix. The health care provider will do a physical exam and may monitor your contractions. If   you are not in true labor, the exam  should show that your cervix is not dilating and your water has not broken. °If there are no prenatal problems or other health problems associated with your pregnancy, it is completely safe for you to be sent home with false labor. You may continue to have Braxton Hicks contractions until you go into true labor. °How can I tell the difference between true labor and false labor? °· Differences °? False labor °? Contractions last 30-70 seconds.: Contractions are usually shorter and not as strong as true labor contractions. °? Contractions become very regular.: Contractions are usually irregular. °? Discomfort is usually felt in the top of the uterus, and it spreads to the lower abdomen and low back.: Contractions are often felt in the front of the lower abdomen and in the groin. °? Contractions do not go away with walking.: Contractions may go away when you walk around or change positions while lying down. °? Contractions usually become more intense and increase in frequency.: Contractions get weaker and are shorter-lasting as time goes on. °? The cervix dilates and gets thinner.: The cervix usually does not dilate or become thin. °Follow these instructions at home: °· Take over-the-counter and prescription medicines only as told by your health care provider. °· Keep up with your usual exercises and follow other instructions from your health care provider. °· Eat and drink lightly if you think you are going into labor. °· If Braxton Hicks contractions are making you uncomfortable: °? Change your position from lying down or resting to walking, or change from walking to resting. °? Sit and rest in a tub of warm water. °? Drink enough fluid to keep your urine clear or pale yellow. Dehydration may cause these contractions. °? Do slow and deep breathing several times an hour. °· Keep all follow-up prenatal visits as told by your health care provider. This is important. °Contact a health care provider if: °· You have a  fever. °· You have continuous pain in your abdomen. °Get help right away if: °· Your contractions become stronger, more regular, and closer together. °· You have fluid leaking or gushing from your vagina. °· You pass blood-tinged mucus (bloody show). °· You have bleeding from your vagina. °· You have low back pain that you never had before. °· You feel your baby’s head pushing down and causing pelvic pressure. °· Your baby is not moving inside you as much as it used to. °Summary °· Contractions that occur before labor are called Braxton Hicks contractions, false labor, or practice contractions. °· Braxton Hicks contractions are usually shorter, weaker, farther apart, and less regular than true labor contractions. True labor contractions usually become progressively stronger and regular and they become more frequent. °· Manage discomfort from Braxton Hicks contractions by changing position, resting in a warm bath, drinking plenty of water, or practicing deep breathing. °This information is not intended to replace advice given to you by your health care provider. Make sure you discuss any questions you have with your health care provider. °Document Released: 02/17/2005 Document Revised: 01/07/2016 Document Reviewed: 01/07/2016 °Elsevier Interactive Patient Education © 2017 Elsevier Inc. ° °

## 2016-09-02 NOTE — Progress Notes (Signed)
   PRENATAL VISIT NOTE  Subjective:  Jacqueline Gates is a 26 y.o. G1P0 at 6074w5d being seen today for ongoing prenatal care.  She is currently monitored for the following issues for this low-risk pregnancy and has Supervision of normal pregnancy and Language barrier on her problem list.  Patient reports no complaints.  Contractions: Not present. Vag. Bleeding: None.  Movement: Present. Denies leaking of fluid.   The following portions of the patient's history were reviewed and updated as appropriate: allergies, current medications, past family history, past medical history, past social history, past surgical history and problem list. Problem list updated.  Objective:   Vitals:   09/02/16 0846  BP: (!) 88/62  Pulse: 81  Weight: 121 lb 11.1 oz (55.2 kg)    Fetal Status: Fetal Heart Rate (bpm): 152 Fundal Height: 31 cm Movement: Present     General:  Alert, oriented and cooperative. Patient is in no acute distress.  Skin: Skin is warm and dry. No rash noted.   Cardiovascular: Normal heart rate noted  Respiratory: Normal respiratory effort, no problems with respiration noted  Abdomen: Soft, gravid, appropriate for gestational age. Pain/Pressure: Present     Pelvic:  Cervical exam deferred        Extremities: Normal range of motion.  Edema: None  Mental Status: Normal mood and affect. Normal behavior. Normal judgment and thought content.   Assessment and Plan:  Pregnancy: G1P0 at 6474w5d  1. Encounter for supervision of normal first pregnancy in second trimester - Doing well, no complaints  Preterm labor symptoms and general obstetric precautions including but not limited to vaginal bleeding, contractions, leaking of fluid and fetal movement were reviewed in detail with the patient. Please refer to After Visit Summary for other counseling recommendations.  Return in about 2 weeks (around 09/16/2016) for LOB.   Vonzella NippleJulie Zooey Schreurs, PA-C

## 2016-09-02 NOTE — Progress Notes (Signed)
Video Interpreter # [id number] Educated pt on [employer] [place]  Pt given contact info for Education/Breastfeeding Classes

## 2016-09-18 ENCOUNTER — Encounter: Payer: BLUE CROSS/BLUE SHIELD | Admitting: Advanced Practice Midwife

## 2016-09-24 ENCOUNTER — Encounter: Payer: Self-pay | Admitting: Advanced Practice Midwife

## 2016-09-24 ENCOUNTER — Ambulatory Visit (INDEPENDENT_AMBULATORY_CARE_PROVIDER_SITE_OTHER): Payer: BLUE CROSS/BLUE SHIELD | Admitting: Advanced Practice Midwife

## 2016-09-24 VITALS — BP 96/64 | HR 93 | Wt 125.9 lb

## 2016-09-24 DIAGNOSIS — K5900 Constipation, unspecified: Secondary | ICD-10-CM

## 2016-09-24 DIAGNOSIS — Z3403 Encounter for supervision of normal first pregnancy, third trimester: Secondary | ICD-10-CM

## 2016-09-24 DIAGNOSIS — O2243 Hemorrhoids in pregnancy, third trimester: Secondary | ICD-10-CM

## 2016-09-24 DIAGNOSIS — O99613 Diseases of the digestive system complicating pregnancy, third trimester: Secondary | ICD-10-CM

## 2016-09-24 MED ORDER — HYDROCORTISONE ACETATE 25 MG RE SUPP: 25.0000 mg | Freq: Two times a day (BID) | RECTAL | 1 refills | Status: DC

## 2016-09-24 MED ORDER — POLYETHYLENE GLYCOL 3350 17 GM/SCOOP PO POWD: 17.0000 g | Freq: Every day | ORAL | 2 refills | Status: DC | PRN

## 2016-09-24 NOTE — Progress Notes (Signed)
[employer] interpreter [phone]

## 2016-09-24 NOTE — Patient Instructions (Addendum)
B?nh tr? (Hemorrhoids) B?nh tr l cc t?nh m?ch ? trong ho?c xung quanh tr?c trng hay h?u mn b? s?ng ln. C hai lo?i b?nh tr?:  B?nh tr? n?i. B?nh x?y ra v?i nh?ng t?nh m?ch ? ngay bn trong tr?c trng. Chng c th? tr?i ra ngoi v tr? nn kch ?ng v ?au ??n.  B?nh tr? ngo?i. B?nh x?y ra ? nh?ng t?nh m?ch bn ngoi h?u mn v c th? s? th?y nh? m?t c?c s?ng ?au v c?ng g?n h?u mn. H?u h?t cc bi tr? khng gy v?n ?? nghim tr?ng v c th? ???c x? tr b?ng cch ?i?u tr? t?i nh nh? thay ??i ch? ?? ?n v thay ??i l?i s?ng. N?u ?i?u tr? t?i nh khng lm gi?m tri?u ch?ng, c th? c?n th?c hi?n cc th? thu?t lm co nh? ho?c c?t bi tr?Al [place]. NGUYN NHN B?nh ny do p l?c t?ng ln ? khu v?c h?u mn gy ra. p l?c ny c th? do nhi?u nguyn nhn khc nhau, bao g?m:  To bn.  Ph?i r?n m?nh ?? ??i ti?n.  Tiu ch?y.  [place].  Bo ph.  Ng?i trong th?i gian di.  Nng v?t n?ng ho?c ho?t ??ng khc lm qu v? g?ng s?c.  Quan h? tnh d?c qua h?u mn. TRI?U CH?NG Nh?ng tri?u ch?ng c?a tnh tr?ng ny bao g?m:  ?au.  Ng?a ho?c kch ?ng h?u mn.  Ch?y mu tr?c trng.  R r? phn (phn).  S?ng h?u mn.  M?t ho?c nhi?u u c?c xung quanh h?u mn. CH?N ?ON Tnh tr?ng ny th??ng c th? ???c ch?n ?on b?ng cch ki?m tra qua tr?c quan. Cc ki?m tra ho?c xt nghi?m khc c?ng c th? ???c th?c hi?n, ch?ng h?n nh?:  Khm tr?c trng b?ng tay ?eo g?ng (Khm tr?c trng b?ng ngn tay).  Khm h?u mn b?ng cch s? d?ng m?t ?ng nh? (?ng soi h?u mn).  Xt nghi?m mu, n?u qu v? b? m?t m?t l??ng mu ?ng k?.  Ki?m tra xem bn trong ??i trng (soi ??i trng sigma ho?c soi ??i trng). ?I?U TR? Tnh tr?ng ny th??ng c th? ?i?u tr? t?i nh. Tuy nhin, c th? c?n lm nhi?u th? thu?t khc nhau n?u vi?c thay ??i ch? ?? ?n u?ng, thay ??i l?i s?ng v cc bi?n php ?i?u tr? khc t?i nh khng lm gi?m tri?u ch?ng. Nh?ng th? thu?t ny c th? gip lm cho cc bi tr? nh? h?n ho?c c?t b? chng hon ton.  M?t s? th? thu?t lin quan ??n ph?u thu?t v m?t s? khc th khng. Cc th? thu?t ph? bi?n bao g?m:  Th?t tr? b?ng vng cao su. Cc vng cao su ???c ??t ? g?c cc bi tr? ?? ng?t dng mu cung c?p ??n bi tr?Marland Kitchen.  Li?u php x? ha. Thu?c ???c tim vo bi tr? ?? lm chng co nh? l?i.  Lm ?ng b?ng tia h?ng ngo?i. M?t lo?i n?ng l??ng nh? ???c s? d?ng ?? lo?i b? tr?.  Ph?u thu?t c?t tr?. Bi tr? ???c c?t b? b?ng ph?u thu?t v cc t?nh m?ch cung c?p mu ??n cc bi tr? ny ???c th?t l?i.  Ph?u thu?t c?t tr? b?ng k?p. M?t d?ng c? k?p hnh trn ???c dng ?? c?t b? cc bi tr? v dng k?p ng?n ngu?n cung c?p mu ??n cc bi tr?. H??NG D?N CH?M [place] T?I NH ?n v u?ng  ?n th?c ?n c nhi?u ch?t x? nh? ng? c?c nguyn h?t, cc lo?i ??u,  h?t, tri cy v rau. Hy h?i chuyn gia ch?m [place] s?c kh?e v? vi?c dng cc s?n ph?m c thm ch?t x? (Th?c ph?m b? sung ch?t x?).  U?ng ?? n??c ?? gi? cho n??c ti?u trong ho?c c mu vng nh?t. X? l c?n ?au v s?ng n?  T?m ng?i b?ng n??c ?m trong 20 pht, 3-4 l?n m?i ngy ?? gi?m ?au v gi?m c?m gic kh ch?u.  N?u ???c ch? d?n, ch??m ? l?nh vo vng b? ?nh h??ng. S? d?ng cc ti ch??m l?nh gi?a nh?ng l?n t?m ng?i c th? c tc d?ng. ? Cho ? l?nh vo ti nh?a. ? ?? kh?n t?m ? gi?a da v ti ch??m. ? Ch??m ? l?nh trong kho?ng 20 pht, 2 - 3 l?n m?i ngy. H??ng d?n chung  Ch? s? d?ng thu?c khng c?n k ??n v thu?c c?n k ??n theo ch? d?n c?a chuyn gia ch?m [place] s?c kh?e.  S? d?ng thu?c d?ng kem ho?c vin ??n theo ch? d?n.  T?p th? d?c th??ng xuyn.  Vo nh v? sinh khi qu v? bu?n ?i ??i ti?n. Khng ch? ??i.  Trnh r?n m?nh khi ?i ??i ti?n.  Gi? cho vng h?u mn kh v s?ch. S? d?ng gi?y v? sinh ??t ho?c kh?n gi?y v? sinh ?m sau khi ?i ??i ti?n.  Khng ng?i lu trong nh v? sinh. Vi?c ny lm t?ng ? mu v ?au. ?I KHM N?U:  Qu v? b? ?au v s?ng t?ng ln m khng ki?m sot ???c cch ?i?u tr? ho?c b?ng thu?c.  Qu v? b? ch?y mu khng ki?m sot  ???c.  Qu v? ?i ??i ti?n kh kh?n, ho?c qu v? khng th? ??i ti?n ???c.  Qu v? b? ?au ho?c vim ? ngoi khu v?c c?a cc bi tr?Gery Pray.  Thng tin ny khng nh?m m?c ?ch thay th? cho l?i khuyn m chuyn gia ch?m [place] s?c kh?e ni v?i qu v?. Hy b?o ??m qu v? ph?i th?o lu?n b?t k? v?n ?? g m qu v? c v?i chuyn gia ch?m [place] s?c kh?e c?a qu v?. Document Released: 11/27/2004 Document Revised: 06/11/2015 Document Reviewed: 11/01/2014 Elsevier Interactive Patient Education  2017 Elsevier Inc.   C?n co th?t Braxton Hicks (Braxton Hicks Contractions) Co th?t t? cung c th? x?y ra trong su?t thai k?. Co th?t khng ph?i lc no c?ng l d?u hi?u cho th?y qu v? chuy?n d?. C?N CO TH?T BRAXTON HICKS L G? C?n co th?t x?y ra tr??c khi sinh ???c g?i l c?n co th?t Braxton Hicks, hay chuy?n d? gi?Marland Kitchen. V? cu?i [place] k? (32-24 tu?n), nh?ng c?n co th?t ny x?y ra th??ng xuyn h?n v c th? m?nh h?n. C?n c th?t ny khng ph?i l chuy?n d? th?c s? v chng khng lm m? (gin n?) v lm m?ng c? t? cung. ?i khi c th? kh phn bi?t v?i chuy?n d? th?c s? v nh?ng c?n co th?t ny c th? m?nh v m?i ng??i l?i c s?c ch?u ?au khc nhau. Qu v? ??ng c?m th?y ng??ng ngng n?u qu v? vo b?nh vi?n khi c chuy?n d? gi?. ?i khi, cch duy nh?t ?? bi?t qu v? c chuy?n d? th?t khng l ph?i ?? chuyn gia ch?m [place] s?c kh?e pht hi?n nh?ng thay ??i ? c? t? cung. N?u khng c v?n ?? g tr??c khi sinh ho?c nh?ng v?n ?? s?c kh?e khc lin quan ??n vi?c mang [place], qu v? c th? hon ton an ton tr? v? nh  khi chuy?n d? gi? v ??i cho ??n khi c c?n chuy?n d? th?t. QU V? C TH? NH?N BI?T S? [facility] NHAU GI?A CHUY?N D? TH?T V CHUY?N D? GI? NH? TH? NO? Chuy?n d? gi?  C?n co th?t chuy?n d? gi? th??ng ng?n h?n v khng kh ch?u nh? khi chuy?n d? th?t.  Cc c?n co th?t th??ng khng ??u.  Cc c?n co th?t th??ng ???c c?m nh?n ? ph?n tr??c b?ng d??i v vng b?n.  Cc c?n co th?t c th? h?t khi qu v? ?i l?i ho?c thay ??i t? th? khi  n?m.  Cc c?n co th?t s? y?u h?n v ko di trong th?i gian ng?n h?n theo th?i gian.  Cc c?n co th?t th??ng khng ti?n tri?n m?nh ln, ??u, v li?n nhau h?n nh? khi chuy?n d? th?t. Chuy?n d? th?t  C?n co th?t khi chuy?n d? th?t ko di 30-70 giy, tr? nn r?t ??u, th??ng l m?nh ln, v t?ng t?n su?t.  C?n co th?t khng h?t khi qu v? ?i l?i.  C?m gic kh ch?u th??ng c?m th?y ? pha trn t? cung v lan t?i vng b?ng d??i v vng l?ng d??i.  Chuyn gia ch?m [place] s?c kh?e c th? khm ?? xc ??nh chuy?n d? l th?t. Khi khm s? th?y c? t? cung gin n? v m?ng h?n. C?N NH? ?I?U G  Ti?p t?c cc bi th? d?c thng th??ng c?a qu v? v lm theo cc ch? d?n khc c?a chuyn gia ch?m [place] s?c kh?e.  S? d?ng thu?c theo ch? d?n c?a chuyn gia ch?m [place] s?c kh?e.  Ti?p t?c l?ch h?n khm tr??c sinh nh? bnh th??ng.  ?n v u?ng ?? nh? n?u qu v? ngh? qu v? s?p chuy?n d?.  N?u c?n co th?t Braxton Hicks lm qu v? kh ch?u: ? Thay ??i t? t? th? n?m ho?c ngh? ng?i sang ?i l?i, ho?c t? ?i l?i sang ngh? ng?i. ? Ng?i v ngh? ng?i trong m?t b?n n??c ?m. ? U?ng 2-3 c?c n??c. M?t n??c c th? gy ra nh?ng c?n co th?t nh? v?y. ? Th? ch?m v su vi l?n trong m?t ti?ng.  KHI NO TI C?N ?I KHM NGAY L?P T?C? Hy ?i khm ngay l?p t?c n?u:  Cc c?n co th?t tr? nn m?nh h?n, ??u h?n, v g?n nhau h?n.  Qu v? b? r? d?ch ho?c ti?t d?ch t? m ??o.  Qu v? b? s?t.  Qu v? ra d?ch nh?y c l?n mu.  Qu v? b? ch?y mu m ??o.  Qu v? b? ?au b?ng lin t?c.  Qu v? b? ?au vng th?t l?ng m tr??c ?y ch?a t?ng b? bao gi?[place] Heads v? c?m th?y ??u c?a con qu v? ??y xu?ng d??i v gy p l?c ln vng khung ch?u.  Con qu v? khng c? ??ng nhi?u nh? tr??c. Thng tin ny khng nh?m m?c ?ch thay th? cho l?i khuyn m chuyn gia ch?m [place] s?c kh?e ni v?i qu v?. Hy b?o ??m qu v? ph?i th?o lu?n b?t k? v?n ?? g m qu v? c v?i chuyn gia ch?m [place] s?c kh?e c?a qu v?. Document Released: 02/17/2005 Document  Revised: 06/11/2015 Document Reviewed: 11/29/2012 Elsevier Interactive Patient Education  2017 [employer].

## 2016-09-24 NOTE — Progress Notes (Signed)
   PRENATAL VISIT NOTE  Subjective:  Jacqueline Gates is a 26 y.o. G1P0 at 7812w6d being seen today for ongoing prenatal care.  She is currently monitored for the following issues for this low-risk pregnancy and has Supervision of normal pregnancy and Language barrier on her problem list.  Patient reports possibly hemorrhoirds and constipation. Has pain w/ BM's. No bleeding. .  Contractions: Irritability. Vag. Bleeding: None.  Movement: Present. Denies leaking of fluid.   The following portions of the patient's history were reviewed and updated as appropriate: allergies, current medications, past family history, past medical history, past social history, past surgical history and problem list. Problem list updated.  Objective:   Vitals:   09/24/16 1206  BP: 96/64  Pulse: 93  Weight: 125 lb 14.4 oz (57.1 kg)    Fetal Status: Fetal Heart Rate (bpm): 143 Fundal Height: 34 cm Movement: Present  Presentation: Vertex  General:  Alert, oriented and cooperative. Patient is in no acute distress.  Skin: Skin is warm and dry. No rash noted.   Cardiovascular: Normal heart rate noted  Respiratory: Normal respiratory effort, no problems with respiration noted  Abdomen: Soft, gravid, appropriate for gestational age.  Pain/Pressure: Absent     Pelvic: Cervical exam deferred        Extremities: Normal range of motion.  Edema: None  Rectal refused  Mental Status:  Normal mood and affect. Normal behavior. Normal judgment and thought content.   Assessment and Plan:  Pregnancy: G1P0 at 3512w6d  1. Constipation during pregnancy in third trimester  - polyethylene glycol powder (GLYCOLAX/MIRALAX) powder; Take 17 g by mouth daily as needed for moderate constipation.  Dispense: 500 g; Refill: 2  2. Hemorrhoids during pregnancy in third trimester  - hydrocortisone (ANUSOL-HC) 25 MG suppository; Place 1 suppository (25 mg total) rectally 2 (two) times daily.  Dispense: 12 suppository; Refill: 1  3. Encounter for  supervision of normal first pregnancy in third trimester   Preterm labor symptoms and general obstetric precautions including but not limited to vaginal bleeding, contractions, leaking of fluid and fetal movement were reviewed in detail with the patient. Please refer to After Visit Summary for other counseling recommendations.  F/U 1 week Increase fluids and fiber   Dorathy KinsmanVirginia Oralia Criger, CNM

## 2016-10-08 ENCOUNTER — Ambulatory Visit (INDEPENDENT_AMBULATORY_CARE_PROVIDER_SITE_OTHER): Payer: BLUE CROSS/BLUE SHIELD | Admitting: Advanced Practice Midwife

## 2016-10-08 VITALS — BP 89/70 | HR 91 | Wt 126.4 lb

## 2016-10-08 DIAGNOSIS — Z113 Encounter for screening for infections with a predominantly sexual mode of transmission: Secondary | ICD-10-CM | POA: Diagnosis not present

## 2016-10-08 DIAGNOSIS — Z3400 Encounter for supervision of normal first pregnancy, unspecified trimester: Secondary | ICD-10-CM

## 2016-10-08 DIAGNOSIS — Z331 Pregnant state, incidental: Secondary | ICD-10-CM | POA: Diagnosis not present

## 2016-10-08 DIAGNOSIS — Z3403 Encounter for supervision of normal first pregnancy, third trimester: Secondary | ICD-10-CM

## 2016-10-08 LAB — OB RESULTS CONSOLE GBS: GBS: NEGATIVE

## 2016-10-08 LAB — OB RESULTS CONSOLE GC/CHLAMYDIA: GC PROBE AMP, GENITAL: NEGATIVE

## 2016-10-08 NOTE — Patient Instructions (Signed)
Ba thng th? th? ba c?a thai k? (Third Trimester of Pregnancy) Ba thng th? ba l t? tu?n 29 ??n tu?n 42, thng th? 7 ??n thng th? 9. Ba thng th? ba c?ng l th?i gian khi bo thai ?ang pht tri?n nhanh chng. Vo cu?i thng th? chn, bo thai di kho?ng 20 inch v n?ng kho?ng 6-10 pao. C? TH? THAY ??I C? th? c?a qu v? tr?i qua r?t nhi?u thay ??i trong th?i gian mang thai. Nh?ng thay ??i khc nhau ? cc ph? n? khc nhau.  Cn n?ng c?a qu v? s? ti?p t?c t?ng. Qu v? c th? t?ng ???c 25-35 pao (11 - 16 kg) vo cu?i thai k?.  Qu v? c th? b?t ??u c cc v?t n?t trn hng, b?ng v ng?c.  Qu v? c th? ?i ti?u th??ng xuyn h?n v bo thai di chuy?n xu?ng th?p h?n vo khung ch?u c?a qu v? v ? vo bng quang c?a qu v?.  Qu v? c th? b? ho?c ti?p t?c b? ? nng do vi?c mang thai.  Qu v? c th? b? to bn v m?t s? hoc mn nh?t ??nh ?ang lm cc c? c ch?c n?ng ??y ch?t th?i qua ???ng ru?t ho?t ??ng ch?m l?i.  Qu v? c th? b? b?nh tr? ho?c s?ng, ph?ng t?nh m?ch (gin t?nh m?ch).  Qu v? c th? ?au khung ch?u v t?ng cn v cc hoc mn mang thai lm l?ng kh?p n?i gi?a cc x??ng ? khung ch?u c?a qu v?. ?au l?ng c th? do s? c? g?ng qu s?c c?a c? b?p h? tr? t? th? c?a qu v?.  Qu v? c nh?ng thay ??i v? tc. Nh?ng thay ??i ny c th? bao g?m tc dy ln, tc m?c nhanh h?n v thay ??i v? k?t c?u. M?t s? ph? n? c?ng b? r?ng tc trong khi ho?c sau khi mang thai, ho?c tc c?m th?y kh ho?c m?ng. Tc c?a qu v? s? c nhi?u kh? n?ng s? tr? l?i bnh th??ng sau khi em b ???c sinh ra.  Ng?c qu v? s? ti?p t?c pht tri?n v nh?y c?m ?au. Ch?t d?ch mu vng c th? r? t? v c?a qu v? g?i l s?a non.  R?n c?a qu v? c th? l?i ra.  Qu v? c th? c?m th?y kh th? v t? cung c?a qu v? to ra.  Qu v? c th? nh?n th?y thai nhi "r?i xu?ng", ho?c di chuy?n xu?ng th?p h?n trong b?ng c?a qu v?.  Qu v? c th? b? ti?t d?ch nh?y c mu. ?i?u ny th??ng x?y ra m?t vi ngy ??n m?t tu?n tr??c khi b?t ??u  sinh ??.  C? t? cung c?a qu v? tr? nn m?ng v m?m (b? m?ng c? t? cung) g?n ngy d? sinh c?a qu v?. TRNG ??I ?I?U G TRONG NH?NG L?N KHM TR??C KHI SINH Qu v? s? ???c khm tr??c khi sinh 2 tu?n m?t l?n cho ??n tu?n 36. Sau ?, qu v? s? ???c khm hng tu?n tr??c khi sinh. Trong m?t l?n khm tr??c khi sinh:  Qu v? s? ???c cn n?ng ?? ??m b?o qu v? v thai nhi ?ang pht tri?n bnh th??ng.  Qu v? ???c ?o huy?t p.  Qu v? s? ???c ?o b?ng ?? theo di s? pht tri?n c?a b.  S? nghe th?y nh?p tim thai.  B?t k? k?t qu? ki?m tra no trong l?n khm tr??c ? s? ???c th?o lu?n.  Qu v? c   th? ???c ki?m tra c? t? cung g?n ngy d? sinh ?? xem c? t? cung ? m?ng ch?a. Lc ???c kho?ng 36 tu?n, chuyn gia ch?m sc y t? c?a qu v? s? ki?m tra c? t? cung c?a qu v?. Cng lc ?, chuyn gia ch?m sc y t? c?ng s? th?c hi?n ki?m tra ch?t ti?t ra c?a m m ??o. Ki?m tra ny l ?? xc ??nh xem c m?t lo?i vi khu?n, lin c?u nhm B, hay khng. Chuyn gia ch?m sc y t? c?a qu v? s? gi?i thch thm. Chuyn gia ch?m sc y t? c th? h?i qu v?:  K? ho?ch sinh c?a qu v? l g.  Qu v? c?m th?y th? no.  Qu v? c c?m th?y em b c? ??ng.  Li?u qu v? c b?t k? tri?u ch?ng b?t th??ng no, ch?ng h?n nh? r r? ch?t l?ng, ch?y mu, ?au ??u nghim tr?ng ho?c co th?t ? b?ng.  Li?u qu v? c ?ang s? d?ng b?t k? s?n ph?m thu?c l no, bao g?m thu?c l d?ng ht, thu?c l d?ng nhai v thu?c l ?i?n t?.  Li?u qu v? c b?t k? cu h?i no khng. Nh?ng ki?m tra ho?c sng l?c khc c th? ???c th?c hi?n trong k? ba thng th? hai c?a qu v? bao g?m:  Cc xt nghi?m mu ki?m tra n?ng ?? s?t th?p (thi?u mu).  Th? thai ?? ki?m tra s?c kh?e, m?c ?? ho?t ??ng v s? pht tri?n c?a thai nhi. Th? nghi?m ???c th?c hi?n n?u qu v? c m?t s? tnh tr?ng b?nh l, ho?c n?u c nh?ng v?n ?? trong qu trnh mang thai.  Xt nghi?m HIV (Vi rt suy gi?m mi?n d?ch ? ng??i). N?u qu v? c nguy c? cao, qu v? c th? ???c ki?m tra HIV  trong ba thng th? ba c?a k? mang thai. CHUY?N D? GI? Qu v? c th? c?m th?y co th?t nh?, khng ??u m d?n d?n s? h?t. Chng ???c g?i l c?n co th?t Braxton Hicks, hay chuy?n d? gi?. Co th?t c th? ko di trong nhi?u gi?, nhi?u ngy, ho?c th?m ch nhi?u tu?n tr??c khi chuy?n d? th?t. N?u c?n co th?t ??u ??n, t?ng c??ng, ho?c tr? nn ?au ??n, t?t nh?t l ph?i ??n g?p chuyn gia ch?m sc y t? ?? khm. CC D?U HI?U TR? D?  Co th?t gi?ng nh? khi c kinh nguy?t.  Cc c?n co th?t cch nhau 5 pht ho?c nhanh h?n.  Co th?t b?t ??u t? ??nh t? cung v lan xu?ng b?ng d??i v l?ng.  C?m gic p l?c ln khung ch?u t?ng ln ho?c ?au l?ng.  Ti?t d?ch nh?y c n??c ho?c mu t? m ??o. N?u qu v? c b?t k? nh?ng d?u hi?u ny tr??c tu?n 37 c?a thai k?, g?i cho chuyn gia ch?m sc y t? ngay l?p t?c. Qu v? s? c?n ??n b?nh vi?n ?? ???c ki?m tra ngay l?p t?c. H??NG D?N CH?M SC T?I NH  Trnh t?t c? thu?c l, th?o d??c, r??u v ma ty khng ???c k ??n. Cc ha ch?t ny ?nh h??ng ??n s? hnh thnh v pht tri?n c?a em b.  Khng s? d?ng b?t c? cc s?n ph?m thu?c l no, bao g?m thu?c l d?ng ht, thu?c l d?ng nhai v thu?c l ?i?n t?. N?u qu v? c?n gip ?? ?? cai thu?c, hy h?i chuyn gia ch?m sc s?c kh?e. Qu v? c th? nh?n ???c h? tr? t?   v?n v cc ngu?n h? tr? khc ?? gip qu v? b? thu?c l.  Tun th? cc ch? d?n c?a chuyn gia ch?m sc y t? v? vi?c s? d?ng thu?c. C nh?ng lo?i thu?c l an ton ho?c khng an ton trong qu trnh mang thai.  T?p th? d?c th??ng xuyn theo ch? d?n c?a chuyn gia ch?m sc y t?. B? co th?t t? cung l m?t d?u hi?u t?t ?? ng?ng t?p th? d?c.  Ti?p t?c ?n cc b?a ?n th??ng xuyn, lnh m?nh.  M?c m?t chi?c o ng?c nng ?? t?t cho v b? nh?y c?m ?au.  Khng s? d?ng b?n t?m n??c nng, phng xng h?i, ho?c phng t?m h?i.  ?eo dy an ton c?a qu v? m?i lc khi li xe.  Trnh th?t s?ng, ph mai ch?a n?u chn, h?p v? sinh c?a mo, v ??t v? sinh dnh cho mo. Nh?ng th? ny mang  m?m b?nh c th? gy d? t?t b?m sinh cho em b.  U?ng vitamin tr??c khi sinh c?a qu v?.  U?ng 1500-2000 mg canxi m?i ngy b?t ??u t? tu?n th? 20 c?a thai k? cho ??n khi sinh con.  Hy th? dng thu?c lm m?m phn (n?u chuyn gia ch?m sc y t? c?a qu v? ch?p thu?n) n?u qu v? b? to bn. ?n thm th?c ?n giu ch?t x?, nh? rau t??i ho?c tri cy v ng? c?c nguyn h?t. U?ng nhi?u n??c ?? gi? cho n??c ti?u trong ho?c vng nh?t.  T?m b?n ng?i ?? lm d?u c?n ?au ho?c s? kh ch?u do b?nh tr? gy ra. S? d?ng kem tr? n?u chuyn gia ch?m sc y t? ch?p thu?n.  N?u qu v? b? gin t?nh m?ch, hy ?eo t?t h? tr?. Nng cao chn c?a qu v? trong 15 pht, 3-4 l?n m?t ngy. H?n ch? mu?i trong ch? ?? ?n c?a qu v?.  Trnh nng v?t n?ng, hay ?i giy th?p gt, v luy?n t?p t? th? ph h?p.  Ngh? ng?i nhi?u v?i ?i chn c?a qu v? nng cao n?u qu v? b? chu?t rt chn ho?c ?au vng th?t l?ng.  ??n khm nha s? n?u qu v? ch?a ??n trong th?i gian qu v? mang thai. S? d?ng m?t bn ch?i m?m ?? ch?i r?ng c?a qu v? v hy nh? nhng khi dng ch? nha khoa.  C th? ???c ti?p t?c c quan h? tnh d?c tr? khi chuyn gia ch?m sc y t? yu c?u khc.  Khng ?i du l?ch xa tr? khi n l hon ton c?n thi?t v ch? ?i v?i s? ch?p thu?n c?a chuyn gia ch?m sc y t?.  Tham gia cc l?p h?c tr??c khi sinh ?? hi?u, th?c hnh, v ??t cu h?i v? tr? d? v sinh con.  Th?c hi?n m?t l?n th? t?i b?nh vi?n.  Chu?n b? ti ?? ?? ?i vi?n.  Chu?n b? phng cho em b.  Ti?p t?c ??n m?i l?n h?n khm tr??c khi sinh theo ch? d?n c?a chuyn gia ch?m sc y t? c?a qu v?.  ?I KHM N?U:  Qu v? khng ch?c ch?n li?u qu v? c tr? d? ho?c li?u qu v? ? v? ?i hay ch?a.  Qu v? b? hoa m?t.  Qu v? b? co th?t vng ch?u, p l?c ln vng ch?u, ?au m ? ? vng b?ng c?a qu v?.  Qu v? b? bu?n nn, nn m?a ho?c tiu ch?y lin t?c.  Qu v? c d?ch m ??o mi kh ch?u.    Qu v? b? ?au khi ?i ti?u.  NGAY L?P T?C ?I KHM N?U:  Qu v? b? s?t.  Qu  v? b? r r? d?ch t? m ??o.  Qu v? c ra ??m mu ho?c ch?y mu t? m ??o.  Qu v? b? co th?t nghim tr?ng ho?c ?au ? b?ng.  Qu v? gi?m cn ho?c t?ng cn nhanh.  Qu v? b? kh th? cng v?i ?au ng?c.  Qu v? th?y s?ng b?t ng? ho?c r?t to ? m?t, tay, m?t c chn, bn chn ho?c c?ng chn.  Qu v? khng th?y em b c? ??ng trong h?n m?t gi?.  Qu v? b? ?au ??u nghim tr?ng m khng h?t sau khi dng thu?c.  Qu v? b? thay ??i th? l?c.  Thng tin ny khng nh?m m?c ?ch thay th? cho l?i khuyn m chuyn gia ch?m sc s?c kh?e ni v?i qu v?. Hy b?o ??m qu v? ph?i th?o lu?n b?t k? v?n ?? g m qu v? c v?i chuyn gia ch?m sc s?c kh?e c?a qu v?. Document Released: 03/16/2015 Document Revised: 03/16/2015 Document Reviewed: 04/20/2012 Elsevier Interactive Patient Education  2017 [employer] Inc.  

## 2016-10-08 NOTE — Progress Notes (Signed)
Video Interpreter # [id number]

## 2016-10-09 LAB — GC/CHLAMYDIA PROBE AMP (~~LOC~~) NOT AT ARMC
Chlamydia: NEGATIVE
Neisseria Gonorrhea: NEGATIVE

## 2016-10-09 NOTE — Progress Notes (Signed)
   PRENATAL VISIT NOTE  Subjective:  Jacqueline Gates is a 26 y.o. G1P0 at 3539w0d being seen today for ongoing prenatal care.  She is currently monitored for the following issues for this low-risk pregnancy and has Supervision of normal pregnancy and Language barrier on her problem list.  Patient reports no complaints.  Contractions: Irritability. Vag. Bleeding: None.  Movement: Present. Denies leaking of fluid.   The following portions of the patient's history were reviewed and updated as appropriate: allergies, current medications, past family history, past medical history, past social history, past surgical history and problem list. Problem list updated.  Objective:   Vitals:   10/08/16 1002  BP: (!) 89/70  Pulse: 91  Weight: 126 lb 6.4 oz (57.3 kg)    Fetal Status: Fetal Heart Rate (bpm): 163   Movement: Present     General:  Alert, oriented and cooperative. Patient is in no acute distress.  Skin: Skin is warm and dry. No rash noted.   Cardiovascular: Normal heart rate noted  Respiratory: Normal respiratory effort, no problems with respiration noted  Abdomen: Soft, gravid, appropriate for gestational age.  Pain/Pressure: Present     Pelvic: Cervical exam performed        Extremities: Normal range of motion.  Edema: None  Mental Status:  Normal mood and affect. Normal behavior. Normal judgment and thought content.   Assessment and Plan:  Pregnancy: G1P0 at 6939w0d  1. Supervision of normal first pregnancy, antepartum      - Culture, beta strep (group b only) - GC/Chlamydia probe amp ([place]  Interpretor used  Term labor symptoms and general obstetric precautions including but not limited to vaginal bleeding, contractions, leaking of fluid and fetal movement were reviewed in detail with the patient. Please refer to After Visit Summary for other counseling recommendations.  Return in about 1 week (around 10/15/2016) for [facility].   Wynelle BourgeoisMarie Pricilla Moehle, CNM

## 2016-10-12 LAB — CULTURE, BETA STREP (GROUP B ONLY): Strep Gp B Culture: NEGATIVE

## 2016-10-19 ENCOUNTER — Inpatient Hospital Stay (HOSPITAL_COMMUNITY): Payer: BLUE CROSS/BLUE SHIELD | Admitting: Anesthesiology

## 2016-10-19 ENCOUNTER — Inpatient Hospital Stay (HOSPITAL_COMMUNITY)
Admission: AD | Admit: 2016-10-19 | Discharge: 2016-10-22 | DRG: 774 | Disposition: A | Discharge status: Home / Self Care | Payer: BLUE CROSS/BLUE SHIELD | Source: Ambulatory Visit | Attending: Obstetrics & Gynecology | Admitting: Obstetrics & Gynecology

## 2016-10-19 ENCOUNTER — Inpatient Hospital Stay (HOSPITAL_COMMUNITY): Payer: BLUE CROSS/BLUE SHIELD

## 2016-10-19 ENCOUNTER — Encounter (HOSPITAL_COMMUNITY): Payer: Self-pay

## 2016-10-19 DIAGNOSIS — D649 Anemia, unspecified: Secondary | ICD-10-CM | POA: Diagnosis present

## 2016-10-19 DIAGNOSIS — R55 Syncope and collapse: Secondary | ICD-10-CM | POA: Diagnosis not present

## 2016-10-19 DIAGNOSIS — Z3A38 38 weeks gestation of pregnancy: Secondary | ICD-10-CM | POA: Diagnosis not present

## 2016-10-19 DIAGNOSIS — O4690 Antepartum hemorrhage, unspecified, unspecified trimester: Secondary | ICD-10-CM

## 2016-10-19 DIAGNOSIS — O9902 Anemia complicating childbirth: Secondary | ICD-10-CM | POA: Diagnosis present

## 2016-10-19 LAB — CBC
HEMATOCRIT: 39.1 % (ref 36.0–46.0)
Hemoglobin: 13.6 g/dL (ref 12.0–15.0)
MCH: 28.7 pg (ref 26.0–34.0)
MCHC: 34.8 g/dL (ref 30.0–36.0)
MCV: 82.5 fL (ref 78.0–100.0)
Platelets: 177 10*3/uL (ref 150–400)
RBC: 4.74 MIL/uL (ref 3.87–5.11)
RDW: 13.8 % (ref 11.5–15.5)
WBC: 8.8 10*3/uL (ref 4.0–10.5)

## 2016-10-19 LAB — TYPE AND SCREEN
ABO/RH(D): O POS
Antibody Screen: NEGATIVE

## 2016-10-19 LAB — ABO/RH: ABO/RH(D): O POS

## 2016-10-19 MED ORDER — EPHEDRINE 5 MG/ML INJ
10.0000 mg | INTRAVENOUS | Status: DC | PRN
  Filled 2016-10-19: qty 2

## 2016-10-19 MED ORDER — ACETAMINOPHEN 325 MG PO TABS: 650.0000 mg | ORAL_TABLET | ORAL | Status: DC | PRN

## 2016-10-19 MED ORDER — OXYCODONE-ACETAMINOPHEN 5-325 MG PO TABS: 1.0000 | ORAL_TABLET | ORAL | Status: DC | PRN

## 2016-10-19 MED ORDER — OXYTOCIN 40 UNITS IN LACTATED RINGERS INFUSION - SIMPLE MED
2.5000 [IU]/h | INTRAVENOUS | Status: DC
  Filled 2016-10-19: qty 1000

## 2016-10-19 MED ORDER — FENTANYL CITRATE (PF) 100 MCG/2ML IJ SOLN
100.0000 ug | INTRAMUSCULAR | Status: DC | PRN
  Administered 2016-10-19 (×3): 100 ug via INTRAVENOUS
  Filled 2016-10-19 (×3): qty 2

## 2016-10-19 MED ORDER — LACTATED RINGERS IV SOLN
INTRAVENOUS | Status: DC
  Administered 2016-10-19: 125 mL/h via INTRAVENOUS

## 2016-10-19 MED ORDER — PHENYLEPHRINE 40 MCG/ML (10ML) SYRINGE FOR IV PUSH (FOR BLOOD PRESSURE SUPPORT)
80.0000 ug | PREFILLED_SYRINGE | INTRAVENOUS | Status: DC | PRN
  Filled 2016-10-19: qty 5

## 2016-10-19 MED ORDER — LACTATED RINGERS IV SOLN
500.0000 mL | Freq: Once | INTRAVENOUS | Status: AC
  Administered 2016-10-20: 500 mL via INTRAVENOUS

## 2016-10-19 MED ORDER — FENTANYL 2.5 MCG/ML BUPIVACAINE 1/10 % EPIDURAL INFUSION (WH - ANES): 14.0000 mL/h | INTRAMUSCULAR | Status: DC | PRN

## 2016-10-19 MED ORDER — PHENYLEPHRINE 40 MCG/ML (10ML) SYRINGE FOR IV PUSH (FOR BLOOD PRESSURE SUPPORT)
80.0000 ug | PREFILLED_SYRINGE | INTRAVENOUS | Status: DC | PRN
  Filled 2016-10-19: qty 5
  Filled 2016-10-19: qty 10

## 2016-10-19 MED ORDER — OXYTOCIN BOLUS FROM INFUSION
500.0000 mL | Freq: Once | INTRAVENOUS | Status: AC
  Administered 2016-10-20: 500 mL via INTRAVENOUS

## 2016-10-19 MED ORDER — SOD CITRATE-CITRIC ACID 500-334 MG/5ML PO SOLN: 30.0000 mL | ORAL | Status: DC | PRN

## 2016-10-19 MED ORDER — LIDOCAINE HCL (PF) 1 % IJ SOLN
INTRAMUSCULAR | Status: DC | PRN
  Administered 2016-10-19 (×2): 4 mL

## 2016-10-19 MED ORDER — LACTATED RINGERS IV SOLN
500.0000 mL | INTRAVENOUS | Status: DC | PRN
  Administered 2016-10-20: 500 mL via INTRAVENOUS

## 2016-10-19 MED ORDER — OXYCODONE-ACETAMINOPHEN 5-325 MG PO TABS: 2.0000 | ORAL_TABLET | ORAL | Status: DC | PRN

## 2016-10-19 MED ORDER — ONDANSETRON HCL 4 MG/2ML IJ SOLN: 4.0000 mg | Freq: Four times a day (QID) | INTRAMUSCULAR | Status: DC | PRN

## 2016-10-19 MED ORDER — LIDOCAINE HCL (PF) 1 % IJ SOLN
30.0000 mL | INTRAMUSCULAR | Status: DC | PRN
  Filled 2016-10-19: qty 30

## 2016-10-19 MED ORDER — FENTANYL 2.5 MCG/ML BUPIVACAINE 1/10 % EPIDURAL INFUSION (WH - ANES)
14.0000 mL/h | INTRAMUSCULAR | Status: DC | PRN
  Administered 2016-10-19: 11.8 mL/h via EPIDURAL
  Filled 2016-10-19: qty 100

## 2016-10-19 MED ORDER — DIPHENHYDRAMINE HCL 50 MG/ML IJ SOLN: 12.5000 mg | INTRAMUSCULAR | Status: DC | PRN

## 2016-10-19 NOTE — H&P (Signed)
LABOR AND DELIVERY ADMISSION HISTORY AND PHYSICAL NOTE  Jacqueline Gates is a 26 y.o. female G1P0 with IUP at [redacted]w[redacted]d by 7-wk U/S who presented to MAU with contractions and vaginal bleeding. Evaluation showed SVE 1/40/-2 and unchanged on recheck, but vaginal bleeding noted in MAU. U/S was obtained which was negative for placenta previa or abruption. Admitted for IOL/agumentation.  She reports positive fetal movement. She denies leakage of fluid.  Prenatal History/Complications: Clinic  [facility] Prenatal Labs  Dating  Early [place]  Blood type: O/POS/-- (01/24 1534)   Genetic Screen 1 Screen: nml   AFP: negative Antibody:NEG (01/24 1534)  Anatomic [place]  Normal, left choroid plexus 3.3 mm;--CP cyst resolved at 25 weeks.   Rubella: 3.53 (01/24 1534)  GTT  Third trimester: Normal RPR: NON REAC (01/24 1534)   Flu vaccine 03/26/16 HBsAg: NEGATIVE (01/24 1534)   TDaP vaccine  08/06/16                                            HIV: NONREACTIVE (01/24 1534)   Baby Food  Bottle                                             GBS: (For PCN allergy, check sensitivities)  Neg  Contraception  Condoms - reviewed w/ patient Pap: nml 2018  Circumcision  Desires (inpatient - [employer])   Pediatrician [facility]   Support Person  Brooke Dare (husband)    Choroid plexus cyst on U/S, resulted at 25 weeks  Past Medical History: Past Medical History:  Diagnosis Date  . Medical history non-contributory     Past Surgical History: Past Surgical History:  Procedure Laterality Date  . NO PAST SURGERIES      Obstetrical History: OB History    Gravida Para Term Preterm AB Living   1             SAB TAB Ectopic Multiple Live Births                  Social History: Social History   Social History  . Marital status: Married    Spouse name: N/A  . Number of children: N/A  . Years of education: N/A   Social History Main Topics  . Smoking status: Never Smoker  . Smokeless tobacco: Never Used  . Alcohol  use No  . Drug use: No  . Sexual activity: Yes   Other Topics Concern  . None   Social History Narrative  . None    Family History: History reviewed. No pertinent family history.  Allergies: No Known Allergies  Prescriptions Prior to Admission  Medication Sig Dispense Refill Last Dose  . Prenatal Vit-Fe Fumarate-FA (PRENATAL MULTIVITAMIN) TABS tablet Take 1 tablet by mouth daily at 12 noon.   Past Month at Unknown time  . hydrocortisone (ANUSOL-HC) 25 MG suppository Place 1 suppository (25 mg total) rectally 2 (two) times daily. (Patient not taking: Reported on 10/08/2016) 12 suppository 1 Not Taking at Unknown time  . polyethylene glycol powder (GLYCOLAX/MIRALAX) powder Take 17 g by mouth daily as needed for moderate constipation. (Patient not taking: Reported on 10/08/2016) 500 g 2 Not Taking at Unknown time     Review of Systems  All systems reviewed and negative except as stated in HPI  Blood pressure 100/74, pulse 78, temperature 98 F (36.7 C), temperature source Oral, resp. rate 16, height 5\' 3"  (1.6 m), weight 125 lb 10.6 oz (57 kg), last menstrual period 01/25/2016. General appearance: alert and cooperative Lungs: normal effort; no respiratory distress Heart: regular rate Abdomen: soft, non-tender Extremities: No calf swelling or tenderness Presentation: cephalic Fetal monitoring: baseline rate 150, moderate variability, +acel, no devel Uterine activity: ctx q~3 min Dilation: 1 Effacement (%): 50 Exam by:: m bhambri cnm   Prenatal labs: ABO, Rh: O/POS/-- (01/24 1534) Antibody: NEG (01/24 1534) Rubella: !Error! RPR: Non Reactive (06/06 1004)  HBsAg: NEGATIVE (01/24 1534)  HIV: NONREACTIVE (01/24 1534)  GBS: Negative (08/08 0000)  GTT: normal Genetic screening:  Normal first trimester screen Anatomy US: L choroid plexus cyst; improved on 25-week U/S  Prenatal Transfer Tool  Maternal Diabetes: No Genetic Screening: Normal Maternal Ultrasounds/Referrals:   L choroid plexus cyst; improved on 25-week U/S Fetal Ultrasounds or other Referrals:  None Maternal Substance Abuse:  No Significant Maternal Medications:  None Significant Maternal Lab Results: None  Results for orders placed or performed during the hospital encounter of 10/19/16 (from the past 24 hour(s))  CBC   Collection Time: 10/19/16  4:26 PM  Result Value Ref Range   WBC 8.8 4.0 - 10.5 K/uL   RBC 4.74 3.87 - 5.11 MIL/uL   Hemoglobin 13.6 12.0 - 15.0 g/dL   HCT 78.2 95.6 - 21.3 %   MCV 82.5 78.0 - 100.0 fL   MCH 28.7 26.0 - 34.0 pg   MCHC 34.8 30.0 - 36.0 g/dL   RDW 08.6 57.8 - 46.9 %   Platelets 177 150 - 400 K/uL    Assessment: Jacqueline Gates is a 26 y.o. G1P0 at [redacted]w[redacted]d here for third trimester bleeding and regular contractions. Ddx includes early labor vs small abruption.  #Labor: Admit and augment labor. Will place FB #Pain: Per patient's request #FWB: Cat I #ID:  GBS neg #MOF: breast #MOC:condoms #Circ:  Yes, inpatient  Kandra Nicolas Alesia Oshields 10/19/2016, 5:12 PM

## 2016-10-19 NOTE — Progress Notes (Signed)
Patient ID: Jacqueline Gates, female   DOB: 1990-10-27, 26 y.o.   MRN: [id number]  Sitting up for epidural; FB came out @ 2200  BP 111/67, other VSS FHR 130s, +accels, occ mi variables, Cat 1 Ctx q 3-5 mins, spont Cx was 4/50/-3 when foley came out  IUP@term  Third tri vag bldg IOL process  After comfortable w/ epidural, will start Pit to strengthen ctx Anticipate SVD  Cam Hai CNM 10/19/2016 10:59 PM

## 2016-10-19 NOTE — Anesthesia Preprocedure Evaluation (Signed)
Anesthesia Evaluation  Patient identified by MRN, date of birth, ID band Patient awake    Reviewed: Allergy & Precautions, Patient's Chart, lab work & pertinent test results  Airway Mallampati: II  TM Distance: >3 FB Neck ROM: Full    Dental no notable dental hx.    Pulmonary neg pulmonary ROS,    Pulmonary exam normal breath sounds clear to auscultation       Cardiovascular negative cardio ROS Normal cardiovascular exam Rhythm:Regular Rate:Normal     Neuro/Psych negative neurological ROS  negative psych ROS   GI/Hepatic negative GI ROS, Neg liver ROS,   Endo/Other  negative endocrine ROS  Renal/GU negative Renal ROS     Musculoskeletal negative musculoskeletal ROS (+)   Abdominal   Peds  Hematology negative hematology ROS (+)   Anesthesia Other Findings   Reproductive/Obstetrics (+) Pregnancy                             Anesthesia Physical Anesthesia Plan  ASA: II  Anesthesia Plan: Epidural   Post-op Pain Management:    Induction:   PONV Risk Score and Plan:   Airway Management Planned:   Additional Equipment:   Intra-op Plan:   Post-operative Plan:   Informed Consent: I have reviewed the patients History and Physical, chart, labs and discussed the procedure including the risks, benefits and alternatives for the proposed anesthesia with the patient or authorized representative who has indicated his/her understanding and acceptance.       Plan Discussed with:   Anesthesia Plan Comments:         Anesthesia Quick Evaluation  

## 2016-10-19 NOTE — MAU Note (Signed)
Reports waking up in the middle of night with some stomach pain and bleeding. Thinks she is having ctx every 10 min. Describes the bleeding as dark brown with some clots. No cervical exam for 2 weeks. +fetal movement, no LOF

## 2016-10-19 NOTE — Anesthesia Procedure Notes (Signed)
Epidural Patient location during procedure: OB  Staffing Anesthesiologist: Analea Muller Performed: anesthesiologist   Preanesthetic Checklist Completed: patient identified, pre-op evaluation, timeout performed, IV checked, risks and benefits discussed and monitors and equipment checked  Epidural Patient position: sitting Prep: site prepped and draped and DuraPrep Patient monitoring: heart rate, continuous pulse ox and blood pressure Approach: midline Location: L3-L4 Injection technique: LOR air and LOR saline  Needle:  Needle type: Tuohy  Needle gauge: 17 G Needle length: 9 cm Needle insertion depth: 5 cm Catheter type: closed end flexible Catheter size: 19 Gauge Catheter at skin depth: 10 cm Test dose: negative  Assessment Sensory level: T8 Events: blood not aspirated, injection not painful, no injection resistance, negative IV test and no paresthesia  Additional Notes Reason for block:procedure for pain     

## 2016-10-19 NOTE — MAU Provider Note (Signed)
History     CSN: [id number]  Arrival date and time: 10/19/16 1106    Chief Complaint  Patient presents with  . Contractions  . Vaginal Bleeding   G1 @38 .3 weeks here with VB and ctx. Both sx started around 0400. Ctx are q10 min. Bleeding was seen in the toilet and the toilet paper, can not quantify. Denies LOF. Reports good FM. No recent IC. Pregnancy has been uncomplicated, no previa or LL placenta. Was not dilated on last SVE.     OB History    Gravida Para Term Preterm AB Living   1             SAB TAB Ectopic Multiple Live Births                  Past Medical History:  Diagnosis Date  . Medical history non-contributory     Past Surgical History:  Procedure Laterality Date  . NO PAST SURGERIES      History reviewed. No pertinent family history.  Social History  Substance Use Topics  . Smoking status: Never Smoker  . Smokeless tobacco: Never Used  . Alcohol use No    Allergies: No Known Allergies  Prescriptions Prior to Admission  Medication Sig Dispense Refill Last Dose  . Prenatal Vit-Fe Fumarate-FA (PRENATAL MULTIVITAMIN) TABS tablet Take 1 tablet by mouth daily at 12 noon.   Past Month at Unknown time  . hydrocortisone (ANUSOL-HC) 25 MG suppository Place 1 suppository (25 mg total) rectally 2 (two) times daily. (Patient not taking: Reported on 10/08/2016) 12 suppository 1 Not Taking at Unknown time  . polyethylene glycol powder (GLYCOLAX/MIRALAX) powder Take 17 g by mouth daily as needed for moderate constipation. (Patient not taking: Reported on 10/08/2016) 500 g 2 Not Taking at Unknown time    Review of Systems  Gastrointestinal: Positive for abdominal pain.  Genitourinary: Positive for vaginal bleeding.   Physical Exam   Blood pressure 96/70, pulse 92, temperature 98 F (36.7 C), temperature source Oral, resp. rate 16, height 5' 2.99" (1.6 m), weight 125 lb 10.6 oz (57 kg), last menstrual period 01/25/2016.  Physical Exam  Nursing note and vitals  reviewed. Constitutional: She is oriented to person, place, and time. She appears well-developed and well-nourished. No distress.  HENT:  Head: Normocephalic and atraumatic.  Neck: Normal range of motion.  Cardiovascular: Normal rate.   Respiratory: Effort normal. No respiratory distress.  GI: Soft. She exhibits no distension. There is no tenderness.  gravid  Genitourinary:  Genitourinary Comments: External: no lesions or erythema Vagina: rugated, pink, moist, small red mucous discharge from os, no active bleeding SVE: 1/40/-2,vtx   Musculoskeletal: Normal range of motion.  Neurological: She is alert and oriented to person, place, and time.  Skin: Skin is warm and dry.  Psychiatric: She has a normal mood and affect.  EFM: 140 bpm, mod variability, + accels, no decels Toco: 1.5-4   MAU Course  Procedures  MDM Small amt of bright red blood on pad 1 hr after SVE, no cervical change, will send for [place] and evaluate placenta. [place]: prelim > no previa or abnormality, nml AFI. Cervix unchanged, bloody show present, continues ctx q2-4, uncomfortable at times. Discussed with Dr. Debroah Loop no explanation for bloody show since no cervical change, therefore unexplained VB and will admit for IOL. Dr. Nira Retort notified and will manage.  Assessment and Plan  38 weeks Reactive NST Vaginal bleeding in third trimester Admit to BS IOL GBS neg  Jacqueline Gates,  CNM 10/19/2016, 3:34 PM

## 2016-10-20 ENCOUNTER — Encounter: Payer: BLUE CROSS/BLUE SHIELD | Admitting: Obstetrics & Gynecology

## 2016-10-20 ENCOUNTER — Encounter (HOSPITAL_COMMUNITY): Payer: Self-pay | Admitting: *Deleted

## 2016-10-20 DIAGNOSIS — Z3A38 38 weeks gestation of pregnancy: Secondary | ICD-10-CM

## 2016-10-20 LAB — HEMOGLOBIN AND HEMATOCRIT, BLOOD
HEMATOCRIT: 31.5 % — AB (ref 36.0–46.0)
HEMOGLOBIN: 10.9 g/dL — AB (ref 12.0–15.0)

## 2016-10-20 LAB — CBC
HCT: 27.2 % — ABNORMAL LOW (ref 36.0–46.0)
Hemoglobin: 9.5 g/dL — ABNORMAL LOW (ref 12.0–15.0)
MCH: 28.9 pg (ref 26.0–34.0)
MCHC: 34.9 g/dL (ref 30.0–36.0)
MCV: 82.7 fL (ref 78.0–100.0)
PLATELETS: 133 10*3/uL — AB (ref 150–400)
RBC: 3.29 MIL/uL — AB (ref 3.87–5.11)
RDW: 13.8 % (ref 11.5–15.5)
WBC: 13 10*3/uL — ABNORMAL HIGH (ref 4.0–10.5)

## 2016-10-20 LAB — RPR: RPR: NONREACTIVE

## 2016-10-20 MED ORDER — DIPHENHYDRAMINE HCL 25 MG PO CAPS: 25.0000 mg | ORAL_CAPSULE | Freq: Four times a day (QID) | ORAL | Status: DC | PRN

## 2016-10-20 MED ORDER — TETANUS-DIPHTH-ACELL PERTUSSIS 5-2.5-18.5 LF-MCG/0.5 IM SUSP: 0.5000 mL | Freq: Once | INTRAMUSCULAR | Status: DC

## 2016-10-20 MED ORDER — COCONUT OIL OIL: 1.0000 "application " | TOPICAL_OIL | Status: DC | PRN

## 2016-10-20 MED ORDER — ONDANSETRON HCL 4 MG PO TABS: 4.0000 mg | ORAL_TABLET | ORAL | Status: DC | PRN

## 2016-10-20 MED ORDER — DIBUCAINE 1 % RE OINT: 1.0000 "application " | TOPICAL_OINTMENT | RECTAL | Status: DC | PRN

## 2016-10-20 MED ORDER — ACETAMINOPHEN 325 MG PO TABS: 650.0000 mg | ORAL_TABLET | ORAL | Status: DC | PRN

## 2016-10-20 MED ORDER — BENZOCAINE-MENTHOL 20-0.5 % EX AERO: 1.0000 "application " | INHALATION_SPRAY | CUTANEOUS | Status: DC | PRN

## 2016-10-20 MED ORDER — BENZOCAINE-MENTHOL 20-0.5 % EX AERO
1.0000 "application " | INHALATION_SPRAY | CUTANEOUS | Status: DC | PRN
  Administered 2016-10-20: 1 via TOPICAL
  Filled 2016-10-20: qty 56

## 2016-10-20 MED ORDER — TERBUTALINE SULFATE 1 MG/ML IJ SOLN
0.2500 mg | Freq: Once | INTRAMUSCULAR | Status: DC | PRN
  Filled 2016-10-20: qty 1

## 2016-10-20 MED ORDER — SENNOSIDES-DOCUSATE SODIUM 8.6-50 MG PO TABS: 2.0000 | ORAL_TABLET | ORAL | Status: DC

## 2016-10-20 MED ORDER — IBUPROFEN 600 MG PO TABS: 600.0000 mg | ORAL_TABLET | Freq: Four times a day (QID) | ORAL | Status: DC

## 2016-10-20 MED ORDER — OXYCODONE HCL 5 MG PO TABS: 5.0000 mg | ORAL_TABLET | ORAL | Status: DC | PRN

## 2016-10-20 MED ORDER — ONDANSETRON HCL 4 MG/2ML IJ SOLN: 4.0000 mg | INTRAMUSCULAR | Status: DC | PRN

## 2016-10-20 MED ORDER — ZOLPIDEM TARTRATE 5 MG PO TABS: 5.0000 mg | ORAL_TABLET | Freq: Every evening | ORAL | Status: DC | PRN

## 2016-10-20 MED ORDER — FERROUS SULFATE 325 (65 FE) MG PO TABS
325.0000 mg | ORAL_TABLET | Freq: Every day | ORAL | Status: DC
  Administered 2016-10-21 – 2016-10-22 (×2): 325 mg via ORAL
  Filled 2016-10-20 (×3): qty 1

## 2016-10-20 MED ORDER — WITCH HAZEL-GLYCERIN EX PADS
1.0000 "application " | MEDICATED_PAD | CUTANEOUS | Status: DC | PRN
  Administered 2016-10-20: 1 via TOPICAL

## 2016-10-20 MED ORDER — WITCH HAZEL-GLYCERIN EX PADS: 1.0000 "application " | MEDICATED_PAD | CUTANEOUS | Status: DC | PRN

## 2016-10-20 MED ORDER — OXYTOCIN 40 UNITS IN LACTATED RINGERS INFUSION - SIMPLE MED
1.0000 m[IU]/min | INTRAVENOUS | Status: DC
  Administered 2016-10-20: 2 m[IU]/min via INTRAVENOUS

## 2016-10-20 MED ORDER — SENNOSIDES-DOCUSATE SODIUM 8.6-50 MG PO TABS
2.0000 | ORAL_TABLET | ORAL | Status: DC
  Administered 2016-10-20: 2 via ORAL
  Filled 2016-10-20: qty 2

## 2016-10-20 MED ORDER — PRENATAL MULTIVITAMIN CH: 1.0000 | ORAL_TABLET | Freq: Every day | ORAL | Status: DC

## 2016-10-20 MED ORDER — ACETAMINOPHEN 325 MG PO TABS
650.0000 mg | ORAL_TABLET | ORAL | Status: DC | PRN
  Administered 2016-10-20 – 2016-10-21 (×2): 650 mg via ORAL
  Filled 2016-10-20 (×3): qty 2

## 2016-10-20 MED ORDER — SIMETHICONE 80 MG PO CHEW: 80.0000 mg | CHEWABLE_TABLET | ORAL | Status: DC | PRN

## 2016-10-20 MED ORDER — LACTATED RINGERS IV BOLUS (SEPSIS)
1000.0000 mL | Freq: Once | INTRAVENOUS | Status: AC
  Administered 2016-10-20: 1000 mL via INTRAVENOUS

## 2016-10-20 MED ORDER — PRENATAL MULTIVITAMIN CH
1.0000 | ORAL_TABLET | Freq: Every day | ORAL | Status: DC
  Administered 2016-10-20 – 2016-10-22 (×3): 1 via ORAL
  Filled 2016-10-20 (×3): qty 1

## 2016-10-20 MED ORDER — IBUPROFEN 600 MG PO TABS
600.0000 mg | ORAL_TABLET | Freq: Four times a day (QID) | ORAL | Status: DC
  Administered 2016-10-20 – 2016-10-22 (×8): 600 mg via ORAL
  Filled 2016-10-20 (×8): qty 1

## 2016-10-20 NOTE — Progress Notes (Signed)
   10/20/16 1100  Vital Signs  BP (!) 88/50  BP Location Left Arm  Patient Position (if appropriate) Semi-fowlers  BP Method Automatic  Pulse Rate 96  Pulse Rate Source Dinamap  Resp 18  Temp 98.1 F (36.7 C)  Temp Source Oral  POSS Scale ([place] Opioid Sedation Scale)  POSS *See Group Information* 1-Acceptable,Awake and alert  Oxygen Therapy  SpO2 99 %  O2 Device Room Air  late entry. See BP above. Repeated BP x 4, continues to be low.  Patient symptomatic, feels dizzy. Dr. Doroteo Glassman notified, see new orders. Will continue to monitor.

## 2016-10-20 NOTE — Progress Notes (Signed)
   10/20/16 1230  Vital Signs  BP (!) 89/58  BP Location Right Arm  Patient Position (if appropriate) Semi-fowlers  BP Method Automatic  Pulse Rate 91  Pulse Rate Source Dinamap  Dr. Doroteo Glassman aware of new BP taken.  Pt to start iron. Will continue to monitor.

## 2016-10-20 NOTE — Lactation Note (Signed)
This note was copied from a baby's chart. Lactation Consultation Note  Patient Name: Jacqueline Gates DHRCB'U Date: 10/20/2016 Reason for consult: Initial assessment  Follow up visit with portable video/phone [place] interpreter ID# [id number]. Mom reports she only wants to formula feed here in the hospital due to being to weak and tired.  LC explained to mom that her breasts need stimulation at least 8x/24 hours to establish a good milk supply.  Mom reports she understands and will teach baby to breast feed when she is at home.  FOB at bedside supportive.  FOB reports bottle feeding about 1 hour ago with .  Baby also had void and stool.  FOB reports baby had hiccups. Parents aware bottle is only good for 1 hour.  Parents report they plan to purchase formula for home use as needed. [facility] LC resources given and discussed.  Encouraged to feed with early cues on demand. Hand expression demonstrated with colostrum visible.  Mom to call for assist as needed.     Maternal Data Has patient been taught Hand Expression?: Yes  Feeding    LATCH Score                   Interventions    Lactation Tools Discussed/Used     Consult Status Consult Status: Follow-up Date: 10/20/16 Follow-up type: In-patient    Franz Dell 10/20/2016, 3:33 PM

## 2016-10-20 NOTE — Lactation Note (Signed)
This note was copied from a baby's chart. Lactation Consultation Note  Patient Name: Jacqueline Gates Date: 10/20/2016 Reason for consult: Initial assessment  Initial visit at 6 hours of age.  Mom is talking on the phone in the bed.  FOB reports mom speaks vietnamese and I can get an interpreter for this visit, mom will be off phone soon.  LC brought in portable video interpreter and waited several minutes for connection.   FOB reports mom wants to bottle feed today and may try bottle feeding tomorrow or after she is home and can take a shower.  LC asked FOB if waiting to feed the baby is important to him due to cultural beliefs   FOB reports mom doesn't have milk right now.  Lc offered to show mom hand expression, mom is agreeable.  Mom is laid back in bed recovering from delivery, FOB reports mom is not feeling well due to delivery.  Mom has soft breasts with large everted nipples.  Lc was able to hand express a few drops from both breasts and then mom was also able to try hand expression.  Mom appears happy to see colostrum. LC having difficulty with portable video interpreter with crossed lines requiring LC to end call.  LC asked if mom will want to breastfeed baby now or wait until baby wakes up and bottle feed.  FOB again states plans to give baby bottle at next feeding.  Baby asleep in crib and mom appears tired. LC encouraged parents to rest and Lc will return later to attempt initial consult teaching.   LC reported to [facility] RN, Maralyn Sago.    Maternal Data Has patient been taught Hand Expression?: Yes  Feeding    LATCH Score                   Interventions    Lactation Tools Discussed/Used     Consult Status Consult Status: Follow-up Date: 10/20/16 Follow-up type: In-patient    Franz Dell 10/20/2016, 2:44 PM

## 2016-10-20 NOTE — Progress Notes (Signed)
Patient ID: CIGI PICCOLI, female   DOB: 1990-04-14, 26 y.o.   MRN: [id number]  Called to room- pt feeling like she has to have a bowel movement  VSS, afeb FHR 150s, +10x10accels, no decels Ctx q 2-4 mins w/ Pit @ 61mu/min Cx C/C/vtx+2, BBOW ruptured w/ exam for clear fluid  IUP@term  End 1st stage  Will begin pushing w/ ctx Anticipate SVD  Cam Hai CNM 10/20/2016 6:03 AM

## 2016-10-20 NOTE — Anesthesia Postprocedure Evaluation (Signed)
Anesthesia Post Note  Patient: Jacqueline Gates  Procedure(s) Performed: * No procedures listed *     Patient location during evaluation: Mother Baby Anesthesia Type: Epidural Level of consciousness: awake and alert and oriented Pain management: pain level controlled Vital Signs Assessment: post-procedure vital signs reviewed and stable Respiratory status: spontaneous breathing and nonlabored ventilation Cardiovascular status: stable Postop Assessment: no headache, patient able to bend at knees, no backache, no signs of nausea or vomiting, epidural receding and adequate PO intake Anesthetic complications: no    Last Vitals:  Vitals:   10/20/16 1230 10/20/16 1600  BP: (!) 89/58 (!) 88/59  Pulse: 91 93  Resp:  18  Temp:  36.7 C  SpO2:  100%    Last Pain:  Vitals:   10/20/16 1600  TempSrc: Oral  PainSc: 4    Pain Goal: Patients Stated Pain Goal: 2 (10/20/16 1600)               [name]

## 2016-10-20 NOTE — Progress Notes (Signed)
Saw and examined patient after being notified that she had syncope while getting up from the toilet at around 0830 this am.  Patient says she feels a little light-headed while lying in bed but that she feels better.  She   She appears to have some pallor but is AAO x 3.  Postpartum EBL calculated to be 80 cc.  Current pad contains some blood but is not soaked through.  Fundal massage did not result in more blood loss.  Her blood pressure was 95/54 before her syncopal event and is now 98/83. Will have patient eat in the next hour and continue to monitor closely.  We would like to see if she can ambulate without syncope or presyncope before transfer to postpartum floor.

## 2016-10-20 NOTE — Plan of Care (Signed)
Problem: Urinary Elimination: Goal: Ability to reestablish a normal urinary elimination pattern will improve Outcome: Progressing Pt due to void.  Will call for help to bathroom.

## 2016-10-21 LAB — CBC
HCT: 26.7 % — ABNORMAL LOW (ref 36.0–46.0)
HEMOGLOBIN: 9.4 g/dL — AB (ref 12.0–15.0)
MCH: 28.9 pg (ref 26.0–34.0)
MCHC: 35.2 g/dL (ref 30.0–36.0)
MCV: 82.2 fL (ref 78.0–100.0)
Platelets: 149 10*3/uL — ABNORMAL LOW (ref 150–400)
RBC: 3.25 MIL/uL — ABNORMAL LOW (ref 3.87–5.11)
RDW: 13.9 % (ref 11.5–15.5)
WBC: 14.2 10*3/uL — ABNORMAL HIGH (ref 4.0–10.5)

## 2016-10-21 NOTE — Progress Notes (Signed)
Pt offered tylenol, but states she wants to take it after she eats.  She also c/o of dizziness that is worse when ambulating & states it keeps her from voiding.

## 2016-10-21 NOTE — Progress Notes (Signed)
Interpreter [phone] utilized to assess pt.  Pt bladder noted to be distended & causing abdm pain.  Pt OOB with RN stand by assist to void large amt.  Pt instructed to void frequently to keep bleeding under control & help with pain. Pt tolerated ambulation well, but admits that she is hesitant to ambulate secondary to mild dizziness. Pt instructed to inform RN when ambulating for stand by assist.  Pt & husband verb understanding.  Pt requests to hold scheduled Motrin until after eats to avoid upset stomach.

## 2016-10-21 NOTE — Plan of Care (Signed)
Problem: Activity: Goal: Ability to tolerate increased activity will improve Outcome: Progressing Pt instructed many times today to ambulate & needs reinforcement.  RN at bedside for assistance & pt tolerates well, however, she is nervous about getting dizzy.  Will continue to encourage ambulation.  Problem: Coping: Goal: Ability to identify and utilize available resources and services will improve Outcome: Progressing [place] interpreter used for interactions with patient.  FOB very involved in infant care.  Problem: Pain Management: Goal: General experience of comfort will improve and pain level will decrease Outcome: Progressing Realistic pain goals discussed with pt.  Pt pain level has remained at a tolerable level.  Problem: Urinary Elimination: Goal: Ability to reestablish a normal urinary elimination pattern will improve Outcome: Progressing Pt continues to need encouragement to keep bladder empty.  Educated on importance of frequent voids.

## 2016-10-21 NOTE — Progress Notes (Signed)
  Attestation signed by Lennox Solders, MD at 10/21/2016 12:19 PM  RESIDENT ADDENDUM I have separately seen and examined the patient. I have discussed the findings and exam with the medical student and agree with the above note. I helped develop the management plan that is described in the student's note, and I agree with the content. Additionally I have outlined my exam and assessment/plan below:  PE: General: NAD, tearful on exam due to IV irritation Resp: no increased work of breathing Abd: fundus firm Extremities: no LE swelling, no cords palpated, no other signs of DVT   A/P: ALMETIA NOP is a 26 y.o. female admitted for IOL who experienced 500 cc blood loss during delivery with resulting decrease in Hgb from 13.6 to 9.5.  Had syncopal event on 8/20 am after delivery.  CBC is stable today at 9.4.  Appropriate lochia.  Dispo: Patient should be OOB with supervision frequently today even though she is timid to do this.  She and her husband are worried about her going home tomorrow, so she needs to increase her activity today to feel more confident about discharge.  Lezlie Octave, MD FM Resident, PGY-1 10/21/2016, 10:27 AM      Post Partum Day 1  Subjective:  MISHEL RUISE is a 26 y.o. G1P1001 [redacted]w[redacted]d s/p SVD.  No acute events overnight.  Pt reports that she is still experiencing lightheadedness and is wary of ambulating for fear of falling. She denies problems with voiding or po intake.  She reports nausea and 3 episodes vomiting yesterday that has improved today.  Pain is well controlled.  She has had flatus. She has not had bowel movement.  Lochia Moderate.  Plan for birth control is condoms but she is willing to learn more about more effective methods.  Method of Feeding: bottle and breast.  Objective: BP 99/67 (BP Location: Right Arm)   Pulse 88   Temp 98.2 F (36.8 C) (Oral)   Resp 17   Ht 5\' 3"  (1.6 m)   Wt 57 kg (125 lb 10.6 oz)   LMP 01/25/2016 (Approximate)   SpO2  100%   Breastfeeding? Unknown   BMI 22.26 kg/m   Physical Exam:  General: alert, cooperative and no distress Lochia:normal flow Chest: CTAB Heart: RRR no m/r/g Abdomen: +BS, soft, nontender, fundus firm below umbilicus Uterine Fundus: firm DVT Evaluation: No evidence of DVT seen on physical exam. Extremities: trace edema   Recent Labs (last 2 labs)    Recent Labs  10/20/16 1120 10/21/16 0543  HGB 9.5* 9.4*  HCT 27.2* 26.7*      Assessment/Plan:  ASSESSMENT: FELECITY STADE is a 26 y.o. G1P1001 [redacted]w[redacted]d ppd #1 s/p NSVD doing well.   Patient is worried about the amount of bleeding and pain she is experiencing but was reassured that it that was normal. Nurse that checked the pad said she had a moderate amount of vaginal bleeding. She received a dose of motrin this morning. Pt also requests a stool softener. Will check with nurse to make sure she is receiving the senna on order. Otherwise patient is doing well.  Plan for discharge tomorrow, Circumcision prior to discharge and Contraception currently condoms. Dicussed with pt different methods of BC and gave her a handout in vietnamese that also went over her options.   LOS: 2 days   Obie Dredge Medical Student 10/21/2016, 7:52 AM

## 2016-10-21 NOTE — Progress Notes (Signed)
Post Partum Day 1  Subjective:  Jacqueline Gates is a 26 y.o. G1P1001 [redacted]w[redacted]d s/p SVD.  No acute events overnight.  Pt reports that she is still experiencing lightheadedness and is wary of ambulating for fear of falling. She denies problems with voiding or po intake.  She reports nausea and 3 episodes vomiting yesterday that has improved today.  Pain is well controlled.  She has had flatus. She has not had bowel movement.  Lochia Moderate.  Plan for birth control is condoms but she is willing to learn more about more effective methods.  Method of Feeding: bottle and breast.  Objective: BP 99/67 (BP Location: Right Arm)   Pulse 88   Temp 98.2 F (36.8 C) (Oral)   Resp 17   Ht 5\' 3"  (1.6 m)   Wt 57 kg (125 lb 10.6 oz)   LMP 01/25/2016 (Approximate)   SpO2 100%   Breastfeeding? Unknown   BMI 22.26 kg/m   Physical Exam:  General: alert, cooperative and no distress Lochia:normal flow Chest: CTAB Heart: RRR no m/r/g Abdomen: +BS, soft, nontender, fundus firm below umbilicus Uterine Fundus: firm DVT Evaluation: No evidence of DVT seen on physical exam. Extremities: trace edema   Recent Labs  10/20/16 1120 10/21/16 0543  HGB 9.5* 9.4*  HCT 27.2* 26.7*    Assessment/Plan:  ASSESSMENT: Jacqueline Gates is a 26 y.o. G1P1001 [redacted]w[redacted]d ppd #1 s/p NSVD doing well.   Patient is worried about the amount of bleeding and pain she is experiencing but was reassured that it that was normal. Nurse that checked the pad said she had a moderate amount of vaginal bleeding. She received a dose of motrin this morning. Pt also requests a stool softener. Will check with nurse to make sure she is receiving the senna on order. Otherwise patient is doing well.  Plan for discharge tomorrow, Circumcision prior to discharge and Contraception currently condoms. Dicussed with pt different methods of BC and gave her a handout in vietnamese that also went over her options.   LOS: 2 days   Obie Dredge Medical  Student 10/21/2016, 7:52 AM

## 2016-10-22 MED ORDER — ACETAMINOPHEN 325 MG PO TABS: 650.0000 mg | ORAL_TABLET | ORAL | 1 refills | Status: DC | PRN

## 2016-10-22 MED ORDER — FERROUS SULFATE 325 (65 FE) MG PO TABS: 325.0000 mg | ORAL_TABLET | Freq: Every day | ORAL | 0 refills | Status: DC

## 2016-10-22 MED ORDER — IBUPROFEN 600 MG PO TABS
600.0000 mg | ORAL_TABLET | Freq: Four times a day (QID) | ORAL | 0 refills | Status: DC
Start: 2016-10-22 — End: 2018-03-04

## 2016-10-22 MED ORDER — OXYCODONE HCL 5 MG PO TABS: 5.0000 mg | ORAL_TABLET | ORAL | 0 refills | Status: DC | PRN

## 2016-10-22 NOTE — Discharge Summary (Signed)
OB Discharge Summary  Patient ID: COUNTNEY LEXA MRN: [id number] DOB/AGE: [birth year] 26 y.o.  Admit date: 10/19/2016 Discharge date: 10/22/2016  Admission Diagnoses: IOL for Third trimester bleeding and regular contractions  Intrauterine pregnancy:[redacted]w[redacted]d  Secondary Diagnosis: SVD  Additional problems: none  Discharge Diagnoses: Term Pregnancy Delivered  Hospital Course:  26 y.o. G1P1001 at [redacted]w[redacted]d admitted from the MAU for IOL for third trimester bleeding and regular contractions. Postpartum course complicated by syncope right after delivery. Patient otherwise normal. Started on iron for anemia.   Post partum procedures: Lactation consult  Augmentation: FB, Pitocin  Complications:None  Discharge Exam: Blood pressure (!) 91/59, pulse 68, temperature 98 F (36.7 C), temperature source Oral, resp. rate 18, height 5\' 3"  (1.6 m), weight 57 kg (125 lb 10.6 oz), last menstrual period 01/25/2016, SpO2 98 %, unknown if currently breastfeeding.   Physical Exam: General: alert, cooperative and no distress Lochia:normal flow Chest: CTAB Heart: RRR no m/r/g Abdomen: +BS, soft, nontender, fundus firm below umbilicus Uterine Fundus: firm DVT Evaluation: No evidence of DVT seen on physical exam. Extremities: trace edema  Disposition: 01-Home or Self Care  Diet: routine diet  Activity:Advance as tolerated. Pelvic rest for 6 weeks.   Outpatient follow up:4 week Follow up Appt:Future Appointments Follow-up Information    [facility]. Schedule an appointment as soon as possible for a visit.   Why:  Call and make appointment ot be seen in 4 to 6 weeks Contact information: [address] [place] [address] [phone]          Postpartum contraception: condoms  Newborn Data: Live born female, SVD  Birth Weight:  APGAR: 7, 9  Baby Feeding:Bottle, plans to breast feed at home Disposition:home with mother  Signed: Obie Dredge 10/22/2016, 9:10 AM  OB FELLOW DISCHARGE ATTESTATION  I have seen and examined this patient. I agree with above documentation and have made edits as needed.   Caryl Ada, DO OB Fellow 11:13 AM

## 2016-10-22 NOTE — Progress Notes (Signed)
CSW received consult due to score greater than 9, or positive for SI on [place] Depression Screen.    CSW met with MOB and MOB gave CSW permission to meet with MOB while FOB was present. CSW used "Dexter Lavada Mesi [phone]) to assist with interpreting needs. MOB was receptive to meeting with CSW however, denied any feelings of sadness or signs and symptoms of depression.   CSW provided education regarding Baby Blues vs PMADs and provided MOB with information about support groups held at [facility] encouraged MOB to evaluate her mental health throughout the postpartum period. CSW offered MOB a New Mom Checklist developed by Postpartum Progress and MOB declined it.  CSW encouraged MOB to notify a medical professional if PPD symptoms arise.    Laurey Arrow, MSW, LCSW Clinical Social Work [phone]

## 2016-10-22 NOTE — Progress Notes (Signed)
RN encouraged patient to walk more frequently and to walk in the hall a few times a day to help with healing and pain. Patient educated about importance of eating meals regularly throughout the day and drinking plenty of fluids  for strength and energy needed to care for herself and baby. Video interpreter Greig Castilla [phone] used all questions answered.

## 2016-10-22 NOTE — Discharge Instructions (Signed)
Sinh con qua ????ng m ?a?o, Ch?m so?c sau sinh (Vaginal Delivery, Care After) Hy tham kh?o t? thng tin ny trong vi tu?n t?i. Nh?ng h??ng d?n ny cung c?p cho qu v? thng tin v? cch ch?m Jacqueline Gates b?n thn sau khi sinh con qua ???ng m ?a?o. Chuyn gia ch?m Jacqueline Gates s?c kh?e c?ng c th? c h??ng d?n c? th? h?n cho qu v?. Vi?c ?i?u tr? c?a qu v? ? ???c ln k? ho?ch theo th?c hnh y khoa hi?n t?i, nh?ng v?n ?? ?i khi v?n x?y ra. Hy g?i cho chuyn gia ch?m Jacqueline Gates s?c kh?e n?u qu v? c b?t k? v?n ?? ho?c th?c m?c no. D?? KI?N ?I?U GI? XA?Y RA SAU KHI SINH Sau khi sinh con qua ????ng m ?a?o, thng th???ng se? co?:  Cha?y ma?u m?t chu?t ?? m ?a?o.  ?au nh??c ?? bu?ng, m ?a?o va? vu?ng da gi??a c?a m ?a?o va? h?u mn (?a?y ch?u).  Co th??t vu?ng ch?u.  M?t m?i. H??NG D?N CH?M Jacqueline Gates T?I NH Thu?c  Ch? s? d?ng thu?c khng c?n k ??n v thu?c c?n k ??n theo ch? d?n c?a chuyn gia ch?m Jacqueline Gates s?c kh?e.  N?u qu v? ???c k thu?c khng sinh, hy dng thu?c theo ch? d?n c?a chuyn gia ch?m Jacqueline Gates s?c kh?e. Khng d??ng du?ng thu?c kha?ng sinh cho ??n khi h?t thu?c. La?i xe  Khng li xe ho?c v?n hnh my mc h?ng n?ng trong khi dng thu?c gi?m ?au ???c k ??n.  Khng li xe trong vng 24 gi? n?u qu v? ? dng thu?c an th?n. L?i s?ng  Khng u?ng r??u. ?i?u na?y ??c bi?t quan tro?ng n?u quy? vi? cho con bu? b??ng s??a me? ho??c ?ang du?ng thu?c gia?m ?au.  Khng s? d?ng cc s?n ph?m thu?c l no, bao g?m thu?c l d?ng ht, thu?c l d?ng nhai ho?c thu?c l ?i?n t?. N?u qu v? c?n gip ?? ?? cai thu?c, hy h?i chuyn gia ch?m [place] s?c kh?e. ?n v u?ng  U?ng i?t nh?t 8 ly m?i ly ta?m au-x? n???c m?i nga?y tr?? khi chuyn gia ch?m so?c s??c kho?e ba?o quy? vi? khng la?m v?y. N?u quy? vi? ch?n nui con b??ng s??a me?, quy? vi? co? th? c?n u?ng nhi?u n???c h?n m??c na?y.  ?n ca?c th??c ph?m nhi?u ch?t x? m?i nga?y. Nh??ng th??c ph?m na?y co? th? tra?nh ho??c gia?m ta?o bo?n.  Th??c ph?m nhi?u ch?t x? bao g?m: ? Ngu? c?c va? ba?nh mi? nguyn ca?m. ? Nga?o l??t. ? ??u. ? Tri cy v rau qua?. Hoa?t ??ng  Tr? l?i sinh ho?t bnh th??ng theo ch? d?n c?a chuyn gia ch?m [place] s?c kh?e. H?i chuyn gia ch?m Jacqueline Gates s?c kh?e v? cc ho?t ??ng no an ton cho qu v?.  Ngh? ng?i cng nhi?u cng t?t. C? g??ng nghi? ng?i va? ngu? ch??p m??t khi con quy? vi? ngu?Imagene Sheller.  Khng nng b?t ky? v?t gi? n??ng h?n con quy? vi? ho??c qu 10 lb (4,5 kg) cho ??n khi chuyn gia ch?m so?c s??c kho?e no?i la?m v?y la? an toa?n.  No?i chuy?n v??i chuyn gia ch?m so?c s??c kho?e cu?a quy? vi? v? vi?c khi na?o quy? vi? co? th? quan h? ti?nh du?c. ?i?u na?y phu? thu?c va?o: ? Nguy c? nhi?m tru?ng. ? T?c ?? li?n. ? C?m gic thoa?i ma?i va? mong mu?n quan h? ti?nh du?c. Ch?m so?c m ?a?o  N?u quy? vi? bi? ra?ch m h? ho??c bi? ra?ch m ?a?o, ha?y ki?m tra  khu v??c ?o? m?i nga?y xem co? d?u hi?u nhi?m tru?ng khng. Ki?m tra xem: ? T?y ??, s?ng n?, ho?c ?au nhi?u h?n. ? Nhi?u ch?t d?ch ho?c mu h?n. ? ?m. ? M? ho?c mi hi.  Khng s?? du?ng nu?t b?ng v? sinh ho??c thu?t r??a cho ??n khi chuyn gia ch?m so?c s??c kho?e no?i vi?c na?y la? an toa?n.  Nhi?n xem co? b?t ky? cu?c ma?u na?o ma? co? th? tri ra t?? m ?a?o khng. Nh??ng cu?c ma?u na?y co? th? trng nh? nh??ng cu?c khi? h? ?o? th?m, nu ho??c ?en. H??ng d?n chung  Gi? cho vu?ng ?a?y ch?u s?ch v kh theo ch? d?n c?a chuyn gia ch?m Jacqueline Gates s?c kh?e.  M??c qu?n a?o r?ng, thoa?i ma?i.  Lau t?? tr???c ra sau khi quy? vi? ?i v? sinh.  Hy h?i chuyn gia ch?m Jacqueline Gates s?c kh?e xem qu v? c th? t?m vi hoa sen hay t??m b?n khng. N?u quy? vi? bi? ra?ch m h? ho??c ra?ch ?a?y ch?u trong lu?c sinh con, chuyn gia ch?m so?c s??c kho?e co? th? no?i quy? vi? khng t??m b?n trong m?t khoa?ng th??i gian nh?t ?i?nh.  M??c a?o ng??c h? tr?? cho vu? va? v??a v??n v??i quy? vi?.  N?u co? th?, ha?y nh?? ai  giu?p quy? vi? vi?c nha? va? giu?p ch?m so?c con quy? vi? trong i?t nh?t va?i nga?y sau khi quy? vi? ra vi?n.  Tun th? t?t c? cc cu?c h?n khm l?i cho quy? vi? va? con quy? vi? theo ch? d?n c?a chuyn gia ch?m Jacqueline Gates s?c kh?e. ?i?u ny c vai tr quan tr?ng. ?I KHM N?U:  Quy? vi? b?: ? Khi? h? ?? m ?a?o ma? co? mu?i hi. ? ?i ti?u kh kh?n. ? ?au khi ?i ti?u. ? ??t ng?t t?ng ho??c gia?m s? l?n ?a?i ti?n. ? ?o?, s?ng ho??c ?au nhi?u h?n quanh ch? ra?ch m h? hay ch? ra?ch m ?a?o. ? Nhi?u ch?t di?ch ho??c nhi?u ma?u cha?y ra t?? ch? ra?ch m h? ho??c ch? ra?ch m ?a?o h?n. ? Co? mu? ho??c mu?i hi toa?t ra t?? ch? ra?ch m h? ho??c ch? ra?ch m ?a?o. ? S?t. ? Pht ban. ? I?t quan tm ho??c [employer] tm ??n ca?c hoa?t ??ng ma? quy? vi? t??ng thi?ch thu?. ? Ca?c cu ho?i v? ch?m so?c ba?n thn va? con quy? vi?.  Ch? ra?ch m h? ho??c ch? ra?ch m ?a?o ca?m th?y ?m khi cha?m va?o.  Ch? ra?ch m h? ho??c ch? ra?ch m ?a?o v?n bi? h?? va? khng co? ve? li?n la?i.  Vu? cu?a quy? vi? ?au, c??ng ho??c chuy?n sang mu ?o?Anselmo Rod? vi? ca?m th?y bu?n ho??c lo l??ng b?t th???ng.  Qu v? c?m th?y bu?n nn ho?c qu v? nn.  Qu v? c cc c?c mu ?ng l?n tri ra t? m ??o. N?u quy? vi? co? m?t cu?c ma?u ?ng tri ra t?? m ?a?o, ha?y gi?? la?i ?? cho chuyn gia ch?m so?c s??c kho?e cu?a quy? vi? xem. Khng xa? tri ca?c cu?c ma?u ?ng xu?ng b?n c?u ma? khng nh?? chuyn gia ch?m so?c s??c kho?e ki?m tra.  Qu v? ?i ti?u nhi?u h?n bnh th??ng.  Quy? vi? b? chng m?t ho?c choa?ng va?ng.  Quy? vi? khng h? cho con bu? va? quy? vi? ch?a th?y kinh trong 12 tu?n sau khi sinh con.  Quy? vi? d??ng cho con bu? va? quy? vi? ch?a th?y kinh trong 12 tu?n sau khi ng??ng cho con bu?Jacqueline Gates [employer]  NGAY  L?P T?C ?I KHM N?U:  Quy? vi? bi?: ? ?au ma? khng h?t ho?c khng ??? sau khi du?ng thu?c. ? ?au ng?c. ? Kh th?. ? Nhi?n m?? ho?c nhi?n th?y ??m. ? Y? nghi?  v? vi?c la?m t?n th??ng cho ba?n thn ho?c con quy? vi?.  Qu v? b? ?au b?ng ho?c ?au ? m?t trong Wynelle Fanny v? b? ?au ??u d? d?i.  Qu v? b? ng?t.  Quy? vi? bi? cha?y ma?u ?? m ?a?o nhi?u ??n m??c quy? vi? th?m ???t hai b?ng v? sinh trong vo?ng m?t gi??.  Thng tin ny khng nh?m m?c ?ch thay th? cho l?i khuyn m chuyn gia ch?m [place] s?c kh?e ni v?i qu v?. Hy b?o ??m qu v? ph?i th?o lu?n b?t k? v?n ?? g m qu v? c v?i chuyn gia ch?m [place] s?c kh?e c?a qu v?. Document Released: 02/17/2005 Document Revised: 06/11/2015 Document Reviewed: 03/04/2015 Elsevier Interactive Patient Education  2017 [employer].

## 2016-11-25 ENCOUNTER — Ambulatory Visit: Payer: BLUE CROSS/BLUE SHIELD | Admitting: Medical

## 2016-12-11 ENCOUNTER — Encounter: Payer: Self-pay | Admitting: Obstetrics and Gynecology

## 2016-12-11 ENCOUNTER — Ambulatory Visit (INDEPENDENT_AMBULATORY_CARE_PROVIDER_SITE_OTHER): Payer: BLUE CROSS/BLUE SHIELD | Admitting: Obstetrics and Gynecology

## 2016-12-11 DIAGNOSIS — R102 Pelvic and perineal pain: Secondary | ICD-10-CM

## 2016-12-11 NOTE — Progress Notes (Signed)
Subjective:     SHAINNA FAUX is a 26 y.o. female who presents for a postpartum visit. She is 7 weeks postpartum following a spontaneous vaginal delivery. I have fully reviewed the prenatal and intrapartum course. The delivery was at 38 gestational weeks. Outcome: spontaneous vaginal delivery. Anesthesia: epidural. Postpartum course has been well except for pain with intercourse. Baby's course has been without issue. Baby is feeding by formula Bleeding no bleeding. Bowel function is normal. Bladder function is normal. Patient is not sexually active. Contraception method is condoms. Postpartum depression screening: negative.  The following portions of the patient's history were reviewed and updated as appropriate: allergies, past family history, past medical history, past social history, past surgical history and problem list.  Review of Systems Pertinent items are noted in HPI.   Objective:    There were no vitals taken for this visit.  General:  alert, cooperative and appears stated age   Breasts:  inspection negative, no nipple discharge or bleeding, no masses or nodularity palpable  Lungs: clear to auscultation bilaterally  Heart:  regular rate and rhythm  Abdomen: soft, non-tender   Vulva:  normal  Vagina: posterior distal vagina well approximated with some granulation tissue where tear is healing, no obvious tears or fistula                    Assessment:    Normal postpartum exam with some granulation tissue at posterior distal vagina. Recommended sitz baths for one week, ibuprofen and return if symptoms do not improve. To call if they desire something besides condoms for contraception.  Plan:   1. Contraception: condoms 2. Sitz baths 3. Pap normal 03/2016 4. GTT normal 5. Follow up as needed   K. Therese Sarah, M.D. Attending Obstetrician & Gynecologist, [facility] for [employer], [facility]

## 2016-12-11 NOTE — Patient Instructions (Signed)
How to Take a Sitz Bath °A sitz bath is a warm water bath that is taken while you are sitting down. The water should only come up to your hips and should cover your buttocks. Your health care provider may recommend a sitz bath to help you: °· Clean the lower part of your body, including your genital area. °· With itching. °· With pain. °· With sore muscles or muscles that tighten or spasm. ° °How to take a sitz bath °Take 3-4 sitz baths per day or as told by your health care provider. °1. Partially fill a bathtub with warm water. You will only need the water to be deep enough to cover your hips and buttocks when you are sitting in it. °2. If your health care provider told you to put medicine in the water, follow the directions exactly. °3. Sit in the water and open the tub drain a little. °4. Turn on the warm water again to keep the tub at the correct level. Keep the water running constantly. °5. Soak in the water for 15-20 minutes or as told by your health care provider. °6. After the sitz bath, pat the affected area dry first. Do not rub it. °7. Be careful when you stand up after the sitz bath because you may feel dizzy. ° °Contact a health care provider if: °· Your symptoms get worse. Do not continue with sitz baths if your symptoms get worse. °· You have new symptoms. Do not continue with sitz baths until you talk with your health care provider. °This information is not intended to replace advice given to you by your health care provider. Make sure you discuss any questions you have with your health care provider. °Document Released: 11/10/2003 Document Revised: 07/18/2015 Document Reviewed: 02/15/2014 °Elsevier Interactive Patient Education © 2018 Elsevier Inc. ° °

## 2017-02-12 ENCOUNTER — Encounter: Payer: Self-pay | Admitting: Obstetrics and Gynecology

## 2017-02-12 ENCOUNTER — Ambulatory Visit (INDEPENDENT_AMBULATORY_CARE_PROVIDER_SITE_OTHER): Payer: BLUE CROSS/BLUE SHIELD | Admitting: Obstetrics and Gynecology

## 2017-02-12 VITALS — BP 100/69 | HR 95 | Wt 113.1 lb

## 2017-02-12 DIAGNOSIS — R300 Dysuria: Secondary | ICD-10-CM | POA: Diagnosis not present

## 2017-02-12 DIAGNOSIS — N898 Other specified noninflammatory disorders of vagina: Secondary | ICD-10-CM | POA: Diagnosis not present

## 2017-02-12 DIAGNOSIS — Z113 Encounter for screening for infections with a predominantly sexual mode of transmission: Secondary | ICD-10-CM

## 2017-02-12 MED ORDER — LIDOCAINE HCL 2 % EX GEL: 1.0000 "application " | CUTANEOUS | 2 refills | Status: DC | PRN

## 2017-02-12 MED ORDER — CEPHALEXIN 500 MG PO CAPS: 500.0000 mg | ORAL_CAPSULE | Freq: Four times a day (QID) | ORAL | 0 refills | Status: DC

## 2017-02-12 MED ORDER — DOCUSATE SODIUM 100 MG PO CAPS: 100.0000 mg | ORAL_CAPSULE | Freq: Two times a day (BID) | ORAL | 2 refills | Status: DC | PRN

## 2017-02-12 NOTE — Patient Instructions (Addendum)
Metamucil, fiber gummy, any over the counter fiber supplement   Constipation, Adult Constipation is when a person has fewer bowel movements in a week than normal, has difficulty having a bowel movement, or has stools that are dry, hard, or larger than normal. Constipation may be caused by an underlying condition. It may become worse with age if a person takes certain medicines and does not take in enough fluids. Follow these instructions at home: Eating and drinking   Eat foods that have a lot of fiber, such as fresh fruits and vegetables, whole grains, and beans.  Limit foods that are high in fat, low in fiber, or overly processed, such as french fries, hamburgers, cookies, candies, and soda.  Drink enough fluid to keep your urine clear or pale yellow. General instructions  Exercise regularly or as told by your health care provider.  Go to the restroom when you have the urge to go. Do not hold it in.  Take over-the-counter and prescription medicines only as told by your health care provider. These include any fiber supplements.  Practice pelvic floor retraining exercises, such as deep breathing while relaxing the lower abdomen and pelvic floor relaxation during bowel movements.  Watch your condition for any changes.  Keep all follow-up visits as told by your health care provider. This is important. Contact a health care provider if:  You have pain that gets worse.  You have a fever.  You do not have a bowel movement after 4 days.  You vomit.  You are not hungry.  You lose weight.  You are bleeding from the anus.  You have thin, pencil-like stools. Get help right away if:  You have a fever and your symptoms suddenly get worse.  You leak stool or have blood in your stool.  Your abdomen is bloated.  You have severe pain in your abdomen.  You feel dizzy or you faint. This information is not intended to replace advice given to you by your health care provider. Make  sure you discuss any questions you have with your health care provider. Document Released: 11/16/2003 Document Revised: 09/07/2015 Document Reviewed: 08/08/2015 Elsevier Interactive Patient Education  2017 [employer]  To bn, Ng??i l?n (Constipation, Adult) To bn l khi m?t ng??i ?i ??i ti?n trong m?t tu?n t h?n so v?i bnh th??ng, ??i ti?n kh kh?n, ho?c ??i ti?n ra phn kh, c?ng, ho?c to h?n bnh th??ng. To bn c th? do m?t tnh tr?ng bn trong gy ra. N c th? tr? ln tr?m tr?ng h?n theo ?? tu?i n?u m?t ng??i dng cc lo?i thu?c nh?t ??nh v khng u?ng ?? n??c. H??NG D?N CH?M [place] T?I NH ?n v u?ng  ?n th?c ?n c nhi?u ch?t x? nh? tri cy t??i v rau, ng? c?c nguyn h?t v cc lo?i ??u.  H?n ch? th?c ?n c nhi?u ch?t bo, t ch?t x?, ho?c ch? bi?n s?n qu m?c, ch?ng h?n khoai ty chin, bnh hamburger, bnh quy, k?o v soda.  U?ng ?? n??c ?? gi? cho n??c ti?u trong ho?c c mu vng nh?t. H??ng d?n chung  T?p th? d?c th??ng xuyn theo ch? d?n c?a chuyn gia ch?m [place] s?c kh?e.  ?i v? sinh ngay khi qu v? c nhu c?u. Khng nh?n ?i ??i ti?n.  Ch? s? d?ng thu?c khng c?n k ??n v thu?c c?n k ??n theo ch? d?n c?a chuyn gia ch?m  s?c kh?e. Cc thu?c ny bao g?m b?t k? th?c ph?m ch?c n?ng c ch?t  x? no.  Th?c hnh cc bi t?p luy?n l?i c? ?y ch?u, ch?ng h?n nh? th? su trong lc th? gin b?ng d??i v th? gin ph?n ?y ch?u trong khi ??i ti?n.  Theo di tnh tr?ng c?a qu v? ?? pht hi?n b?t k? thay ??i no.  Tun th? t?t c? cc cu?c h?n khm l?i theo ch? d?n c?a chuyn gia ch?m [place] s?c kh?e. ?i?u ny c vai tr quan tr?ng. ?I KHM N?U:  Qu v? b? ?au va? ?au tr?m tr?ng h?n.  Qu v? b? s?t.  Qu v? khng ?i ??i ti?n sau 4 ngy.  Qu v? nn.  Qu v? khng ?i.  Qu v? b? s?t cn.  Qu v? b? ch?y mu ? h?u mn.  Phn c?a qu v? m?ng, trng nh? bt ch.  NGAY L?P T?C ?I KHM N?U:  Qu v? b? s?t v cc tri?u ch?ng c?a qu v? ??t nhin tr?m tr?ng h?n.  Qu  v? sn phn ho?c c mu trong phn.  B?ng c?a qu v? b? ch??ng ln.  Qu v? b? ?au r?t nhi?u ? b?ng.  Qu v? c?m th?y chng m?t ho?c ng?t x?u.  Thng tin ny khng nh?m m?c ?ch thay th? cho l?i khuyn m chuyn gia ch?m [place] s?c kh?e ni v?i qu v?. Hy b?o ??m qu v? ph?i th?o lu?n b?t k? v?n ?? g m qu v? c v?i chuyn gia ch?m [place] s?c kh?e c?a qu v?. Document Released: 06/04/2010 Document Revised: 03/10/2014 Document Reviewed: 08/08/2015 Elsevier Interactive Patient Education  2017 [employer]

## 2017-02-12 NOTE — Progress Notes (Signed)
Video Interpreter # [id number]

## 2017-02-12 NOTE — Progress Notes (Signed)
26 yo G1P1 s/p SVD on 10/20/2016 complicated by a 3rd degree vaginal laceration presenting for the evaluation of vaginal pain. Patient reports onset of symptoms 2-3 days ago. Patient reports pain is present with sitting and walking and worst with intercourse. She describes the pain as a burning pain. She reports a monthly period lasting 5 days. She is sexually active using condoms for contraception. Patient also reports onset of dysuria. She has been suffering from constipation for the past 2 days and reports rectal bleeding from her hemorrhoid.   Past Medical History:  Diagnosis Date  . Medical history non-contributory    Past Surgical History:  Procedure Laterality Date  . NO PAST SURGERIES     No family history on file. Social History   Tobacco Use  . Smoking status: Never Smoker  . Smokeless tobacco: Never Used  Substance Use Topics  . Alcohol use: No  . Drug use: No   ROS See pertinent in HPI Blood pressure 100/69, pulse 95, weight 113 lb 1.5 oz (51.3 kg), last menstrual period 02/05/2017, not currently breastfeeding.  GENERAL: Well-developed, well-nourished female in no acute distress.  ABDOMEN: Soft, nontender, nondistended. No organomegaly. PELVIC: Normal external female genitalia. 1 cm are on left labia minora of excoriation with overlying granulation tissue, tender to touch. Vagina is pink and rugated.  Normal discharge. Normal appearing cervix. Uterus is normal in size. No adnexal mass or tenderness. EXTREMITIES: No cyanosis, clubbing, or edema, 2+ distal pulses.  A/P 26 yo G1P1 with vaginal pain - wet prep collected - Rx Keflex provided and lidocaine cream - Advised to increase fiber intake to help with constipation. Rx colace provided - patient will be contacted with results - RTC if no improvement in symptoms

## 2017-02-13 LAB — CERVICOVAGINAL ANCILLARY ONLY
BACTERIAL VAGINITIS: POSITIVE — AB
CANDIDA VAGINITIS: NEGATIVE
CHLAMYDIA, DNA PROBE: NEGATIVE
Neisseria Gonorrhea: NEGATIVE
TRICH (WINDOWPATH): NEGATIVE

## 2017-02-14 ENCOUNTER — Other Ambulatory Visit: Payer: Self-pay | Admitting: Obstetrics and Gynecology

## 2017-02-14 LAB — URINE CULTURE: ORGANISM ID, BACTERIA: NO GROWTH

## 2017-02-14 MED ORDER — METRONIDAZOLE 500 MG PO TABS: 500.0000 mg | ORAL_TABLET | Freq: Two times a day (BID) | ORAL | 0 refills | Status: DC

## 2017-02-25 ENCOUNTER — Telehealth: Payer: Self-pay | Admitting: General Practice

## 2017-02-25 NOTE — Telephone Encounter (Signed)
Called patient with pacific interpreter [phone], no answer- left message to call us back for results. Will send letter

## 2017-02-25 NOTE — Telephone Encounter (Signed)
-----   Message from Catalina AntiguaPeggy Constant, MD sent at 02/14/2017  9:01 AM EST ----- Please inform patient of BV infection. [facility] [address]

## 2017-10-09 ENCOUNTER — Ambulatory Visit (INDEPENDENT_AMBULATORY_CARE_PROVIDER_SITE_OTHER): Payer: BLUE CROSS/BLUE SHIELD | Admitting: General Practice

## 2017-10-09 DIAGNOSIS — N898 Other specified noninflammatory disorders of vagina: Secondary | ICD-10-CM

## 2017-10-09 NOTE — Progress Notes (Signed)
Chart reviewed - agree with RN documentation.   

## 2017-10-09 NOTE — Progress Notes (Signed)
Patient presents to office today reporting vaginal itching/irritation as well as white mucous discharge with slight odor. Patient instructed in self swab & specimen collected. Discussed we will contact her with any abnormal results. Patient verbalized understanding.

## 2017-10-12 LAB — CERVICOVAGINAL ANCILLARY ONLY
BACTERIAL VAGINITIS: POSITIVE — AB
Candida vaginitis: POSITIVE — AB

## 2017-10-12 MED ORDER — METRONIDAZOLE 500 MG PO TABS: 500.0000 mg | ORAL_TABLET | Freq: Two times a day (BID) | ORAL | 0 refills | Status: DC

## 2017-10-12 MED ORDER — FLUCONAZOLE 150 MG PO TABS: 150.0000 mg | ORAL_TABLET | Freq: Once | ORAL | 1 refills | Status: AC

## 2017-10-12 NOTE — Addendum Note (Signed)
Addended by: Levie HeritageSTINSON, JACOB J on: 10/12/2017 01:09 PM   Modules accepted: Orders

## 2017-10-13 ENCOUNTER — Telehealth: Payer: Self-pay

## 2017-10-13 NOTE — Telephone Encounter (Signed)
-----   Message from Levie HeritageJacob J Stinson, DO sent at 10/12/2017  1:09 PM EDT ----- Patient has BV and yeast. Flagyl and diflucan sent to pharmacy. Please notify patient

## 2017-10-13 NOTE — Telephone Encounter (Signed)
Informed pt's husband that we have [facility] in [facility] Rx's [facility] [address] If she could please take medications as prescribed.  Pt's husband said he will pick them up for her tomorrow.

## 2017-10-14 ENCOUNTER — Telehealth: Payer: Self-pay

## 2017-10-14 ENCOUNTER — Telehealth: Payer: Self-pay | Admitting: *Deleted

## 2017-10-14 MED ORDER — TERCONAZOLE 0.4 % VA CREA: 1.0000 | TOPICAL_CREAM | Freq: Every day | VAGINAL | 0 refills | Status: AC

## 2017-10-14 MED ORDER — PRENATAL VITAMINS 0.8 MG PO TABS: 1.0000 | ORAL_TABLET | Freq: Every day | ORAL | 12 refills | Status: DC

## 2017-10-14 NOTE — Telephone Encounter (Signed)
Pt's husband left VM stating that 2 prescriptions (Metronidazole, Diflucan) had been called in [facility] for his wife.  He wanted to know if they are safe in pregnancy. Per chart review pt has New Ob appt on 9/3 in this office - no confirmation of pregnancy is on file. I consulted with Vonzella NippleJulie Wenzel, PA and received alternate order for Terazol to replace the Diflucan. I then called and spoke with pt husband who confirmed that he has not obtained the medications yet. I advised that we are changing one of the medications due to pregnancy. We do not have record of a positive pregnancy test. He stated that she had +UPT @ her family doctor's office and he will bring the documentation to the appt on 9/3. He also requested Rx for prenatal vitamins. I advised that he may pick up all prescriptions today. He voiced understanding.

## 2017-10-14 NOTE — Telephone Encounter (Signed)
Pt left message to make sure the Flagyl and terazol cream that his wife received was safe to take in preganancy. Called pt and advised that the Rx was safe and he verbalized understanding.

## 2017-10-25 ENCOUNTER — Other Ambulatory Visit: Payer: Self-pay

## 2017-10-25 ENCOUNTER — Encounter (HOSPITAL_COMMUNITY): Payer: Self-pay | Admitting: Emergency Medicine

## 2017-10-25 ENCOUNTER — Emergency Department (HOSPITAL_COMMUNITY)
Admission: EM | Admit: 2017-10-25 | Discharge: 2017-10-25 | Disposition: A | Discharge status: Home / Self Care | Payer: BLUE CROSS/BLUE SHIELD | Attending: Emergency Medicine | Admitting: Emergency Medicine

## 2017-10-25 DIAGNOSIS — M533 Sacrococcygeal disorders, not elsewhere classified: Secondary | ICD-10-CM | POA: Diagnosis present

## 2017-10-25 NOTE — Discharge Instructions (Addendum)
You may continue to take tylenol for pain,you may also apply heating pad to the area. Please follow up with PCP in 1 week for reevaluation of symptoms.

## 2017-10-25 NOTE — ED Triage Notes (Signed)
Husband stated she has tailbone pain and sore, she is pregnant. No injury  Last pregnancy she had this pain but not as bad as this pain

## 2017-10-25 NOTE — ED Provider Notes (Signed)
[facility] EMERGENCY DEPARTMENT Provider Note   CSN: [id number] Arrival date & time: 10/25/17  4098     History   Chief Complaint Chief Complaint  Patient presents with  . Back Pain    HPI Jacqueline Gates is a 27 y.o. female.  27 y/o G2P1 presents to the ED with a chief complaint of hip/tailbone pain x 20 days. Patient reports the pain around her tail bone worse with sitting and walking.  She also reports the pain begins on her tailbone and radiates to her abdomen.She experienced a similar pain during her first pregnancy but states this pain is worsening. Patient was seen by PCP for this pain and prescribed preplus tablets. She denies any vaginal discharge, bleeding, gynecological complaints or weakness.   The history is provided by the spouse. The history is limited by a language barrier.    Past Medical History:  Diagnosis Date  . Medical history non-contributory     Patient Active Problem List   Diagnosis Date Noted  . Normal delivery 10/20/2016  . Indication for care in labor or delivery 10/19/2016  . Language barrier 07/21/2016  . Supervision of normal pregnancy 03/26/2016    Past Surgical History:  Procedure Laterality Date  . NO PAST SURGERIES       OB History    Gravida  1   Para  1   Term  1   Preterm      AB      Living  1     SAB      TAB      Ectopic      Multiple  0   Live Births  1            Home Medications    Prior to Admission medications   Medication Sig Start Date End Date Taking? Authorizing Provider  acetaminophen (TYLENOL) 325 MG tablet Take 2 tablets (650 mg total) by mouth every 4 (four) hours as needed for moderate pain. Patient not taking: Reported on 12/11/2016 10/22/16   Pincus Large, DO  cephALEXin (KEFLEX) 500 MG capsule Take 1 capsule (500 mg total) by mouth 4 (four) times daily. 02/12/17   Constant, Peggy, MD  docusate sodium (COLACE) 100 MG capsule Take 1 capsule (100 mg total) by  mouth 2 (two) times daily as needed. 02/12/17   Constant, Peggy, MD  ibuprofen (ADVIL,MOTRIN) 600 MG tablet Take 1 tablet (600 mg total) by mouth every 6 (six) hours. Patient not taking: Reported on 12/11/2016 10/22/16   Pincus Large, DO  lidocaine (XYLOCAINE) 2 % jelly Apply 1 application topically as needed. 02/12/17   Constant, Peggy, MD  metroNIDAZOLE (FLAGYL) 500 MG tablet Take 1 tablet (500 mg total) by mouth 2 (two) times daily. 10/12/17   Levie Heritage, DO  penicillin v potassium (VEETID) 500 MG tablet  12/09/16   [provider]  polyethylene glycol powder (GLYCOLAX/MIRALAX) powder Take 17 g by mouth daily as needed for moderate constipation. Patient not taking: Reported on 10/08/2016 09/24/16   Dorathy Kinsman, CNM  Prenatal Multivit-Min-Fe-FA (PRENATAL VITAMINS) 0.8 MG tablet Take 1 tablet by mouth daily. 10/14/17   Marny Lowenstein, PA-C    Family History No family history on file.  Social History Social History   Tobacco Use  . Smoking status: Never Smoker  . Smokeless tobacco: Never Used  Substance Use Topics  . Alcohol use: No  . Drug use: No     Allergies  Patient has no known allergies.   Review of Systems Review of Systems  Constitutional: Negative for chills and fever.  HENT: Negative for ear pain and sore throat.   Eyes: Negative for pain and visual disturbance.  Respiratory: Negative for cough and shortness of breath.   Cardiovascular: Negative for chest pain and palpitations.  Gastrointestinal: Negative for abdominal pain and vomiting.  Genitourinary: Negative for dysuria, hematuria, pelvic pain, vaginal bleeding and vaginal discharge.  Musculoskeletal: Positive for back pain. Negative for arthralgias, joint swelling and myalgias.  Skin: Negative for color change and rash.  Neurological: Negative for seizures and syncope.  All other systems reviewed and are negative.    Physical Exam Updated Vital Signs BP (!) 89/62   Pulse 67   Temp  98.9 F (37.2 C) (Oral)   Resp 16   Ht 5\' 3"  (1.6 m)   Wt 48.1 kg   LMP 08/30/2017   SpO2 99%   BMI 18.78 kg/m   Physical Exam  Constitutional: She is oriented to person, place, and time. She appears well-developed and well-nourished.  HENT:  Head: Normocephalic and atraumatic.  Neck: Normal range of motion. Neck supple.  Cardiovascular: Normal rate and normal heart sounds.  Pulmonary/Chest: Breath sounds normal. She has no wheezes.  Abdominal: Soft. Bowel sounds are normal.  Musculoskeletal: She exhibits no tenderness or deformity.       Lumbar back: She exhibits pain and spasm. She exhibits no swelling, no edema, no deformity and no laceration.       Back:  Pain is not reproducible with palpation,but present with walking    BLE: strength 5/5 with knee flexion and knee extension.Able to dorsiflex and plantaflex with no limitation.  Able to ambulate with no gait instability.    Neurological: She is alert and oriented to person, place, and time.  Skin: Capillary refill takes less than 2 seconds.  Psychiatric: She has a normal mood and affect.  Nursing note and vitals reviewed.    ED Treatments / Results  Labs (all labs ordered are listed, but only abnormal results are displayed) Labs Reviewed - No data to display  EKG None  Radiology No results found.  Procedures Procedures (including critical care time)  Medications Ordered in ED Medications - No data to display   Initial Impression / Assessment and Plan / ED Course  I have reviewed the triage vital signs and the nursing notes.  Pertinent labs & imaging results that were available during my care of the patient were reviewed by me and considered in my medical decision making (see chart for details).     Patient presents with coccyx pain x 10 days. She states pain is similar to the one she had when she was pregnant with her previous child.  She has tried a heating pad but does not seem to work, she has not  tried any medical therapy as she states she is afraid it might hurt the baby.  Pain on examination pain is not reproducible with palpation, she states the pain is worse when she walks.  At this time I have advised patient she can safely take Tylenol to relieve her pain, but should return if she experiences any bleeding, discharge.  She is also advised to follow-up with her PCP in 1 week for reevaluation of symptoms.  Return precautions provided.  Final Clinical Impressions(s) / ED Diagnoses   Final diagnoses:  Coccyx pain    ED Discharge Orders    None  Claude MangesSoto, Naturi Alarid, PA-C 10/25/17 1128    Gerhard MunchLockwood, Robert, MD 10/25/17 1650

## 2017-11-03 ENCOUNTER — Ambulatory Visit (INDEPENDENT_AMBULATORY_CARE_PROVIDER_SITE_OTHER): Payer: BLUE CROSS/BLUE SHIELD | Admitting: Advanced Practice Midwife

## 2017-11-03 ENCOUNTER — Ambulatory Visit: Payer: Self-pay | Admitting: Clinical

## 2017-11-03 ENCOUNTER — Encounter: Payer: Self-pay | Admitting: Advanced Practice Midwife

## 2017-11-03 DIAGNOSIS — Z3480 Encounter for supervision of other normal pregnancy, unspecified trimester: Secondary | ICD-10-CM

## 2017-11-03 DIAGNOSIS — Z113 Encounter for screening for infections with a predominantly sexual mode of transmission: Secondary | ICD-10-CM | POA: Diagnosis not present

## 2017-11-03 DIAGNOSIS — Z3481 Encounter for supervision of other normal pregnancy, first trimester: Secondary | ICD-10-CM

## 2017-11-03 MED ORDER — NATACHEW 28-1 MG PO CHEW: 1.0000 | CHEWABLE_TABLET | Freq: Every day | ORAL | 11 refills | Status: DC

## 2017-11-03 NOTE — Progress Notes (Signed)
Subjective:   Jacqueline Gates is a 27 y.o. G2P1001 at [redacted]w[redacted]d by LMP being seen today for her first obstetrical visit.  Her obstetrical history is significant for none . Patient does intend to breast feed. Pregnancy history fully reviewed.  Patient reports backache and nausea. Patient was seen in the ED with back pain a couple of weeks ago. It has been better since then. She is using OTC pain relieving patches.   HISTORY: OB History  Gravida Para Term Preterm AB Living  2 1 1  0 0 1  SAB TAB Ectopic Multiple Live Births  0 0 0 0 1    # Outcome Date GA Lbr Len/2nd Weight Sex Delivery Anes PTL Lv  2 Current           1 Term 10/20/16 [redacted]w[redacted]d 25:53 / 00:38 7 lb 7.9 oz (3.4 kg) M Vag-Spont EPI  LIV     Name: Lenk,BOY THI     Apgar1: 7  Apgar5: 9    Last pap smear was done January 2018 and was normal  Past Medical History:  Diagnosis Date  . Medical history non-contributory    Past Surgical History:  Procedure Laterality Date  . NO PAST SURGERIES     History reviewed. No pertinent family history. Social History   Tobacco Use  . Smoking status: Never Smoker  . Smokeless tobacco: Never Used  Substance Use Topics  . Alcohol use: No  . Drug use: No   No Known Allergies Current Outpatient Medications on File Prior to Visit  Medication Sig Dispense Refill  . Prenatal Multivit-Min-Fe-FA (PRENATAL VITAMINS) 0.8 MG tablet Take 1 tablet by mouth daily. 30 tablet 12  . acetaminophen (TYLENOL) 325 MG tablet Take 2 tablets (650 mg total) by mouth every 4 (four) hours as needed for moderate pain. (Patient not taking: Reported on 12/11/2016) 30 tablet 1  . cephALEXin (KEFLEX) 500 MG capsule Take 1 capsule (500 mg total) by mouth 4 (four) times daily. (Patient not taking: Reported on 11/03/2017) 28 capsule 0  . docusate sodium (COLACE) 100 MG capsule Take 1 capsule (100 mg total) by mouth 2 (two) times daily as needed. (Patient not taking: Reported on 11/03/2017) 30 capsule 2  . ibuprofen  (ADVIL,MOTRIN) 600 MG tablet Take 1 tablet (600 mg total) by mouth every 6 (six) hours. (Patient not taking: Reported on 12/11/2016) 30 tablet 0  . lidocaine (XYLOCAINE) 2 % jelly Apply 1 application topically as needed. (Patient not taking: Reported on 11/03/2017) 30 mL 2  . metroNIDAZOLE (FLAGYL) 500 MG tablet Take 1 tablet (500 mg total) by mouth 2 (two) times daily. (Patient not taking: Reported on 11/03/2017) 14 tablet 0  . penicillin v potassium (VEETID) 500 MG tablet   0  . polyethylene glycol powder (GLYCOLAX/MIRALAX) powder Take 17 g by mouth daily as needed for moderate constipation. (Patient not taking: Reported on 10/08/2016) 500 g 2   No current facility-administered medications on file prior to visit.     Review of Systems Pertinent items noted in HPI and remainder of comprehensive ROS otherwise negative.  Exam   Vitals:   11/03/17 0858  BP: (!) 84/62  Pulse: 92  Weight: 105 lb 11.2 oz (47.9 kg)      Uterus:     Pelvic Exam: Perineum:    Vulva:    Vagina:     Cervix:    Adnexa:    Bony Pelvis: average  System: General: well-developed, well-nourished female in no acute distress  Breast:  normal appearance, no masses or tenderness   Skin: normal coloration and turgor, no rashes   Neurologic: oriented, normal, negative, normal mood   Extremities: normal strength, tone, and muscle mass, ROM of all joints is normal   HEENT PERRLA, extraocular movement intact and sclera clear, anicteric   Mouth/Teeth mucous membranes moist, pharynx normal without lesions and dental hygiene good   Neck supple and no masses   Cardiovascular: regular rate and rhythm   Respiratory:  no respiratory distress, normal breath sounds   Abdomen: soft, non-tender; bowel sounds normal; no masses,  no organomegaly   Pt informed that the ultrasound is considered a limited OB ultrasound and is not intended to be a complete ultrasound exam.  Patient also informed that the ultrasound is not being completed  with the intent of assessing for fetal or placental anomalies or any pelvic abnormalities.  Explained that the purpose of today's ultrasound is to assess for  viability.  Patient acknowledges the purpose of the exam and the limitations of the study.    +FCA with bedside US and CRL c/w LMP dating.    Assessment:   Pregnancy: G2P1001 Patient Active Problem List   Diagnosis Date Noted  . Supervision of other normal pregnancy, antepartum 11/03/2017  . Language barrier 07/21/2016     Plan:  1. Supervision of other normal pregnancy, antepartum - Routine care - Obstetric Panel, Including HIV - Cervicovaginal ancillary only - SMN1 COPY NUMBER ANALYSIS (SMA Carrier Screen) - Culture, OB Urine   Initial labs drawn. Continue prenatal vitamins. Genetic Screening discussed, First trimester screen, Quad screen and NIPS: undecided. Ultrasound discussed; fetal anatomic survey: requested. Problem list reviewed and updated. The nature of [facility] with multiple MDs and other Advanced Practice Providers was explained to patient; also emphasized that residents, students are part of our team. Routine obstetric precautions reviewed. 50% of 45 min visit spent in counseling and coordination of care. Return in about 4 weeks (around 12/01/2017).

## 2017-11-03 NOTE — BH Specialist Note (Signed)
Integrated Behavioral Health Initial Visit  MRN: [id number] Name: Jacqueline Gates  Number of Integrated Behavioral Health Clinician visits:: 1/6 Session Start time: 9:41 Session End time: 9:51 Total time: 10 minutes  Type of Service: Integrated Behavioral Health- Individual/Family Interpretor:Yes.   Interpretor Name and Language: Vietnamese   Warm Hand Off Completed.       SUBJECTIVE: Jacqueline Gates is a 27 y.o. female accompanied by n/a Patient was referred by Thressa Sheller, CNM for Initial OB introduction to integrated behavioral health services . Patient reports the following symptoms/concerns: Pt states no particular concerns today Duration of problem: n/a; Severity of problem: n/a  OBJECTIVE: Mood: Normal and Affect: Appropriate Risk of harm to self or others: No plan to harm self or others  LIFE CONTEXT: Family and Social: - School/Work: - Self-Care: - Life Changes: Current pregnancy -  GOALS ADDRESSED: n/a  INTERVENTIONS:  Standardized Assessments completed: GAD-7 and PHQ 9  ASSESSMENT: Patient currently experiencing Supervision of other normal pregnancy, antepartum   Patient may benefit from Initial OB introduction to integrated behavioral health services .  PLAN: 1. Follow up with behavioral health clinician on : As needed 2. Behavioral recommendations:  -Begin taking new prenatal vitamin, as prescribed by medical provider 3. Referral(s): Integrated Behavioral Health Services (In Clinic) 4. "From scale of 1-10, how likely are you to follow plan?": 10  Rae Lips, LCSW  Depression screen [facility] 2/9 11/03/2017 12/11/2016 10/08/2016 09/24/2016 08/06/2016  Decreased Interest 1 0 0 1 0  Down, Depressed, Hopeless 1 0 0 0 0  PHQ - 2 Score 2 0 0 1 0  Altered sleeping 1 0 0 0 1  Tired, decreased energy 1 0 0 1 0  Change in appetite 1 0 0 0 0  Feeling bad or failure about yourself  0 0 0 0 0  Trouble concentrating 0 0 0 0 1  Moving slowly or  fidgety/restless 0 0 0 0 1  Suicidal thoughts 0 0 0 0 0  PHQ-9 Score 5 0 0 2 3   GAD 7 : Generalized Anxiety Score 11/03/2017 12/11/2016 10/08/2016 09/24/2016  Nervous, Anxious, on Edge 1 0 0 0  Control/stop worrying 0 0 0 0  Worry too much - different things 0 0 0 0  Trouble relaxing 1 0 0 0  Restless 1 0 0 0  Easily annoyed or irritable 0 0 0 0  Afraid - awful might happen 0 0 0 0  Total GAD 7 Score 3 0 0 0   \

## 2017-11-04 LAB — CERVICOVAGINAL ANCILLARY ONLY
Chlamydia: NEGATIVE
Neisseria Gonorrhea: NEGATIVE

## 2017-11-05 ENCOUNTER — Telehealth: Payer: Self-pay | Admitting: Advanced Practice Midwife

## 2017-11-05 LAB — CULTURE, OB URINE

## 2017-11-05 LAB — URINE CULTURE, OB REFLEX

## 2017-11-05 NOTE — Telephone Encounter (Signed)
Husband called to say his wife's medicine was not at the drug store, and he needed it called in.

## 2017-11-11 LAB — SMN1 COPY NUMBER ANALYSIS (SMA CARRIER SCREENING): Client Specimen ID:: 0

## 2017-11-11 LAB — OBSTETRIC PANEL, INCLUDING HIV
Antibody Screen: NEGATIVE
Basophils Absolute: 0 10*3/uL (ref 0.0–0.2)
Basos: 0 %
EOS (ABSOLUTE): 0.1 10*3/uL (ref 0.0–0.4)
Eos: 1 %
HIV Screen 4th Generation wRfx: NONREACTIVE
Hematocrit: 32.9 % — ABNORMAL LOW (ref 34.0–46.6)
Hemoglobin: 10.7 g/dL — ABNORMAL LOW (ref 11.1–15.9)
Hepatitis B Surface Ag: NEGATIVE
Immature Grans (Abs): 0 10*3/uL (ref 0.0–0.1)
Immature Granulocytes: 0 %
Lymphocytes Absolute: 1.8 10*3/uL (ref 0.7–3.1)
Lymphs: 27 %
MCH: 26.6 pg (ref 26.6–33.0)
MCHC: 32.5 g/dL (ref 31.5–35.7)
MCV: 82 fL (ref 79–97)
Monocytes Absolute: 0.3 10*3/uL (ref 0.1–0.9)
Monocytes: 5 %
Neutrophils Absolute: 4.5 10*3/uL (ref 1.4–7.0)
Neutrophils: 67 %
Platelets: 228 10*3/uL (ref 150–450)
RBC: 4.02 x10E6/uL (ref 3.77–5.28)
RDW: 13.6 % (ref 12.3–15.4)
RPR Ser Ql: NONREACTIVE
Rh Factor: POSITIVE
Rubella Antibodies, IGG: 3.32 index (ref >0.99)
WBC: 6.7 10*3/uL (ref 3.4–10.8)

## 2017-12-01 ENCOUNTER — Encounter: Payer: Self-pay | Admitting: Advanced Practice Midwife

## 2017-12-01 ENCOUNTER — Ambulatory Visit (INDEPENDENT_AMBULATORY_CARE_PROVIDER_SITE_OTHER): Payer: BLUE CROSS/BLUE SHIELD | Admitting: Advanced Practice Midwife

## 2017-12-01 VITALS — BP 87/59 | HR 77 | Wt 107.1 lb

## 2017-12-01 DIAGNOSIS — Z3480 Encounter for supervision of other normal pregnancy, unspecified trimester: Secondary | ICD-10-CM

## 2017-12-01 NOTE — Progress Notes (Signed)
   PRENATAL VISIT NOTE  Subjective:  Jacqueline Gates is a 27 y.o. G2P1001 at [redacted]w[redacted]d being seen today for ongoing prenatal care.  She is currently monitored for the following issues for this low-risk pregnancy and has Language barrier and Supervision of other normal pregnancy, antepartum on their problem list.  Patient reports no complaints.  Contractions: Not present. Vag. Bleeding: None.  Movement: Absent. Denies leaking of fluid.   The following portions of the patient's history were reviewed and updated as appropriate: allergies, current medications, past family history, past medical history, past social history, past surgical history and problem list. Problem list updated.  Objective:   Vitals:   12/01/17 0931  BP: (!) 87/59  Pulse: 77  Weight: 107 lb 1.6 oz (48.6 kg)    Fetal Status: Fetal Heart Rate (bpm): 152   Movement: Absent     General:  Alert, oriented and cooperative. Patient is in no acute distress.  Skin: Skin is warm and dry. No rash noted.   Cardiovascular: Normal heart rate noted  Respiratory: Normal respiratory effort, no problems with respiration noted  Abdomen: Soft, gravid, appropriate for gestational age.  Pain/Pressure: Absent     Pelvic: Cervical exam deferred        Extremities: Normal range of motion.  Edema: None  Mental Status: Normal mood and affect. Normal behavior. Normal judgment and thought content.   Assessment and Plan:  Pregnancy: G2P1001 at [redacted]w[redacted]d  1. Supervision of other normal pregnancy, antepartum - Routine care - [place] MFM OB COMP + 14 WK; Future - Offer Quad screen at next visit  - Declined flu vax today, considering for next visit   Preterm labor symptoms and general obstetric precautions including but not limited to vaginal bleeding, contractions, leaking of fluid and fetal movement were reviewed in detail with the patient. Please refer to After Visit Summary for other counseling recommendations.  Return in about 4 weeks (around  12/29/2017).  Future Appointments  Date Time Provider Department Center  12/29/2017  9:15 AM [facility]  01/11/2018  7:45 AM [facility] [place] 2 [facility]    Thressa Sheller, CNM

## 2017-12-29 ENCOUNTER — Ambulatory Visit (INDEPENDENT_AMBULATORY_CARE_PROVIDER_SITE_OTHER): Payer: BLUE CROSS/BLUE SHIELD | Admitting: Advanced Practice Midwife

## 2017-12-29 VITALS — BP 87/55 | HR 74 | Wt 110.0 lb

## 2017-12-29 DIAGNOSIS — Z789 Other specified health status: Secondary | ICD-10-CM

## 2017-12-29 DIAGNOSIS — Z3480 Encounter for supervision of other normal pregnancy, unspecified trimester: Secondary | ICD-10-CM

## 2017-12-29 DIAGNOSIS — Z1379 Encounter for other screening for genetic and chromosomal anomalies: Secondary | ICD-10-CM

## 2017-12-29 NOTE — Patient Instructions (Signed)
Ba thng th? hai c?a thai k? Second Trimester of Pregnancy Ba thng th? hai l t? tu?n 14 ??n tu?n 27 (thng th? 4 ??n th? thng th? 6). Ba thng th? hai th??ng l th?i gian m qu v? c?m th?y tho?i mi nh?t. C? th? c?a qu v? c?ng ? ?i?u ch?nh ?? ph h?p v?i vi?c mang thai v qu v? b?t ??u c?m th?y t?t h?n v? m?t th? ch?t. Thng th??ng, tnh tr?ng nghn ? t h?n ho?c h?t hon ton, qu v? c th? c nhi?u n?ng l??ng h?n v qu v? c th? ?n ngon mi?ng h?n. Ba thng th? hai c?ng l th?i gian m bo thai ?ang pht tri?n nhanh chng. Vo cu?i thng th? su, bo thai di kho?ng 9 in s? v n?ng kho?ng 1 pao. Qu v? c kh? n?ng s? b?t ??u c?m th?y em b c? ??ng (thai ??p l?n ??u) trong kho?n tu?n 16 ??n 20 c?a thai k?. Cc thay ??i c? th? trong ba thng th? hai c?a thai k? C? th? qu v? b?t ??u tr?i qua nhi?u thay ??i trong ba thng th? hai c?a thai k?. Nh?ng thay ??i khc nhau ? cc ph? n? s? khc nhau.  Cn n?ng c?a qu v? s? ti?p t?c t?ng. Qu v? s? nh?n th?y b?ng ph?ng to ra.  Qu v? c th? b?t ??u c cc v?t r?n trn hng, b?ng v v.  Qu v? c th? b? ?au ??u, c th? gi?m ?au b?ng thu?c. Cc lo?i thu?c ph?i ???c chuyn gia ch?m sc s?c kh?e c?a qu v? ch?p thu?n.  Qu v? c th? ?i ti?u th??ng xuyn h?n v bo thai p vo bng quang c?a qu v?.  Qu v? c th? b? ho?c ti?p t?c b? ? nng do k?t qu? c?a vi?c mang thai.  Qu v? c th? b? to bn v m?t s? hc-mn nh?t ??nh ?ang lm cc c? ??y ch?t th?i qua ???ng ru?t ho?t ??ng ch?m l?i.  Qu v? c th? b? tr? ho?c s?ng, phnh t?nh m?ch (gin t?nh m?ch).  Quy? vi? c th? bi? ?au l?ng. V?n ?? ny l do: ? T?ng cn. ? Cc hc-mn thai k? lm th? gin cc kh?p ? khung ch?u c?a qu v?. ? Thay ??i v? cn n?ng v cc c? h? tr? th?ng b?ng c?a qu v?.  V c?a qu v? s? ti?p t?c pht tri?n v ti?p t?c tr? nn nh?y c?m ?au.  N??u (l?i) c?a qu v? c th? ch?y mu v c th? nh?y c?m v?i vi?c ?nh r?ng v dng ch? nha khoa.  Cc ??m ho?c v?t t?i mu (rm  m, m?t n? thai k?) c th? pht tri?n trn m?t qu v?. V?t ny c th? s? m? sau khi em b ???c sinh ra.  M?t ???ng s?m mu ch?y t? r?n ??n vng lng mu (???ng linea nigra) c th? xu?t hi?n. V?t ny c th? s? m? sau khi em b ???c sinh ra.  Qu v? c nh?ng thay ??i v? tc. Nh?ng thay ??i ny c th? bao g?m tc dy ln, tc m?c nhanh h?n v thay ??i v? k?t c?u tc. M?t s? ph? n? c?ng b? r?ng tc trong khi ho?c sau khi mang thai, ho?c c?m th?y tc kh ho?c m?ng. Tc c?a qu v? s? c nhi?u kh? n?ng s? tr? l?i bnh th??ng sau khi em b ???c sinh ra.  Nh?ng g d? ki?n s? x?y ra ? cc l?n khm tr??c   khi sinh Trong m?t l?n khm tr??c khi sinh th??ng quy:  Qu v? s? ???c cn ?? ??m b?o qu v? v thai nhi ?ang pht tri?n bnh th??ng.  Qu v? s? ???c ?o huy?t p.  Qu v? s? ???c ?o vng b?ng ?? theo di s? pht tri?n c?a b.  Nh?p tim thai s? ???c nghe.  B?t k? k?t qu? ki?m tra no t? l?n khm tr??c ? c?ng s? ???c th?o lu?n.  Chuyn gia ch?m sc s?c kh?e c th? h?i qu v?:  Qu v? c?m th?y th? no.  Qu v? c c?m th?y em b c? ??ng hay khng.  Qu v? c b?t k? tri?u ch?ng b?t th??ng no, ch?ng h?n nh? ch?y d?ch, ch?y mu, ?au ??u r?t nhi?u ho?c co th?t ? b?ng hay khng.  Qu v? c ?ang s? d?ng b?t k? s?n ph?m thu?c l no, bao g?m thu?c l d?ng ht, thu?c l d?ng nhai v thu?c l ?i?n t? hay khng.  Qu v? c b?t k? cu h?i no hay khng.  Nh?ng ki?m tra khc c th? ???c th?c hi?n trong ba thng th? hai c?a qu v? bao g?m:  Xt nghi?m mu ?? ki?m tra: ? L??ng s?t th?p (thi?u mu). ? ???ng huy?t cao ?nh h??ng ??n ph? n? mang thai (ti?u ???ng thai k?) t? tu?n 24 ??n tu?n 28. ? Khng th? RH. Xt nghi?m ny ?? ki?m tra m?t protein trn h?ng c?u (y?u t? Rh).  Xt nghi?m n??c ti?u ?? ki?m tra nhi?m trng, ti?u ???ng ho?c protein trong n??c ti?u.  Siu m ?? xc ??nh s? t?ng tr??ng v pht tri?n ph h?p c?a em b.  Ch?c ?i ?? ki?m tra cc v?n ?? v? di truy?n c th? c.  Ki?m tra bo thai  xem c b? n?t ??t s?ng ho?c h?i ch?ng Down hay khng.  Xt nghi?m HIV (vi rt gy suy gi?m mi?n d?ch ? ng??i). Xt nghi?m tr??c khi sinh th??ng quy bao g?m sng l?c HIV, tr? khi qu v? ch?n khng lm xt nghi?m ny.  Tun th? nh?ng h??ng d?n ny ? nh: Thu?c  Tun th? cc ch? d?n c?a chuyn gia ch?m sc s?c kh?e v? vi?c s? d?ng thu?c. M?t s? lo?i thu?c c? th? co? th? an ton ho?c khng an ton khi dng trong qu trnh mang thai.  U?ng vitamin tr??c khi sinh c t nh?t 600 microgram (mcg) axit folic.  N?u quy? vi? bi? ta?o bo?n, hy th? dng thu?c lm m?m phn n?u chuyn gia ch?m sc s?c kh?e c?a qu v? ch?p thu?n. ?n v u?ng  ?n ch? ?? ?n cn b?ng bao g?m tri cy t??i v rau c?, ng? c?c nguyn cm, cc ngu?n protein t?t nh? th?t, tr?ng, ??u h? v s?a t bo. Chuyn gia ch?m sc s?c kh?e c?a quy? vi? s? gip quy? vi? xc ??nh m??c t?ng cn phu? h??p cho quy? vi?.  Trnh th?t s?ng v pho mt ch?a n?u chn. Nh?ng th? ny mang m?m b?nh c th? gy d? t?t b?m sinh cho em b.  N?u qu v? s? d?ng l??ng canxi th?p t? th?c ph?m, hy trao ??i v?i chuyn gia ch?m sc s?c kh?e v? vi?c qu v? c c?n dng th?c ph?m b? sung canxi hng ngy hay khng.  H?n ch? cc lo?i th?c ?n giu ch?t bo v ???ng tinh luy?n, ch?ng h?n nh? ?? ?n chin rn v ?? ng?t.  ?? ng?n ng?a to bn: ? U?ng ?? n??c ??   gi? cho n??c ti?u trong ho?c c mu vng nh?t. ? ?n th?c ?n giu ch?t x? nh? tri cy t??i v rau, ng? c?c nguyn h?t v cc lo?i ??u. Ho?t ??ng  Ch? t?p th? d?c theo ch? d?n c?a chuyn gia ch?m sc s?c kh?e. H?u h?t ph? n? c th? ti?p t?c ch? ?? t?p luy?n bnh th??ng c?a h? trong khi mang thai. C? g??ng t?p th? du?c 30 phu?t m?i nga?y, t nh?t 5 nga?y m?i tu?n. Ng?ng t?p th? d?c n?u qu v? c cc c?n co t? cung.  Trnh nng v?t n?ng, hy ?i giy th?p gt v luy?n t?p t? th? ph h?p.  C th? ???c ti?p t?c c quan h? tnh d?c tr? khi chuyn gia ch?m sc s?c kh?e yu c?u khc. Gi?m ?au v gi?m c?m gic  kh ch?u  M?c m?t chi?c o ng?c nng ?? v hi?u qu? ?? ng?n ng?a c?m gic kh ch?u do v b? nh?y c?m ?au.  T?m b?n ng?i n??c ?m ?? lm d?u ?au ho?c d?u c?m gic kh ch?u do b?nh tr? gy ra. S? d?ng kem ?i?u tr? tr? n?u chuyn gia ch?m sc s?c kh?e ch?p thu?n.  Ngh? ng?i trong khi nng cao chn c?a qu v? n?u qu v? b? chu?t rt chn ho?c ?au vng th?t l?ng.  N?u qu v? b? gin t?nh m?ch, hy ?eo t?t h? tr?. Nng cao chn c?a qu v? trong 15 pht, 3-4 l?n m?t ngy. H?n ch? mu?i trong ch? ?? ?n c?a qu v?. Ch?m sc tr???c khi sinh  Vi?t ra cu h?i c?a quy? vi?. ?em ca?c cu ho?i ??n ca?c bu?i kha?m tr???c khi sinh.  Tun th? t?t c? cc cu?c h?n khm tr??c khi sinh theo ch? d?n c?a chuyn gia ch?m sc s?c kh?e. ?i?u ny c vai tr quan tr?ng. An ton  Lun ?eo dy th?t an ton c?a qu v? khi li xe.  L?p m?t danh sch cc s? ?i?n tho?i c?p c?u, bao g?m s? ?i?n thoa?i gia ?nh, b?n b, b?nh vi?n v c?nh st v s? c?u h?a. H??ng d?n chung  H?i chuyn gia ch?m sc s?c kh?e c?a quy? vi? ?? ???c gi?i thi?u ??n m?t l?p h?c tr???c khi sinh t?i ??a ph??ng. B?t ??u ca?c l?p h?c khng mu?n h?n vo ??u thng th?? 6 c?a thai k?.  Yu c?u gip ?? n?u quy? vi? c nhu c?u t? v?n ho?c nhu c?u dinh d??ng trong th??i gian mang thai. Chuyn gia ch?m sc s?c kh?e c?a quy? vi? c th? co? l?i khuyn ho?c gi?i thi?u quy? vi? ??n cc chuyn gia ?? ???c gip ?? v? ca?c nhu c?u khc nhau.  Khng s? d?ng b?n t?m n??c nng, phng xng h?i, ho?c nh t?m h?i.  Khng thu?t r??a ho?c s? d?ng nu?t b?ng v? sinh ho?c b?ng v? sinh c mi th?m.  Khng b??t cho chn trong th?i gian di.  Trnh h?p v? sinh c?a mo v ??t v? sinh dnh cho mo. Nh?ng th?? na?y mang vi trng c th? gy ra d? t?t b?m sinh ? tr? v c th? m?t thai nhi do s?y thai ho?c thai ch?t l?u.  Trnh t?t c? khi, th?o d??c, r??u v ma ty khng ???c k ??n. Cc ch?t ha h?c trong nh??ng s?n ph?m ny c th? ?nh h??ng ??n s? hnh thnh v pht tri?n  c?a em b.  Khng s? d?ng b?t k? s?n ph?m no ch?a nicotine ho?c thu?c l, ch?ng ha?n   nh? thu?c l d?ng ht v thu?c l ?i?n t?. N?u qu v? c?n gip ?? ?? cai thu?c, hy h?i chuyn gia ch?m sc s?c kh?e.  ??n khm nha s? n?u qu v? ch?a khm trong th?i gian qu v? mang thai. S? d?ng m?t bn ch?i m?m ?? ?nh r?ng c?a qu v? v hy nh? nhng khi dng ch? nha khoa. Hy lin l?c v?i chuyn gia ch?m sc s?c kh?e n?u:  Qu v? b? chng m?t.  Qu v? b? co th?t nh? ? vng ch?u, t?c n?ng ? vng ch?u, ?au m ? ? vng b?ng.  Qu v? b? bu?n nn, nn m?a ho?c tiu ch?y lin t?c.  Qu v? c d?ch m ??o mi kh ch?u.  Qu v? b? ?au khi ?i ti?u. Yu c?u tr? gip ngay l?p t?c n?u:  Qu v? b? s?t.  Qu v? b? r r? d?ch ? m ??o.  Qu v? c ra ??m mu ho?c ch?y mu ? m ??o.  Qu v? b? co th?t ho?c ?au r?t nhi?u ? b?ng.  Qu v? t?ng cn ho?c s?t cn nhanh.  Qu v? b? kh th? cng v?i ?au ng?c.  Qu v? th?y s?ng ??t ng?t ho?c r?t nhi?u ? m?t, bn tay, m?t c chn, bn chn ho?c chn.  Qu v? khng c?m th?y em b c? ??ng trong h?n m?t gi?.  Qu v? b? ?au ??u r?t nhi?u m khng h?t sau khi dng thu?c.  Qu v? b? thay ??i th? l?c. Tm t?t  Ba thng th? hai l t? tu?n 14 ??n tu?n 27 (thng th? 4 ??n th? thng th? 6). ? c?ng l th?i gian m bo thai ?ang pht tri?n nhanh chng.  C? th? c?a qu v? tr?i qua r?t nhi?u thay ??i trong th?i gian mang thai. Nh?ng thay ??i khc nhau ? cc ph? n? s? khc nhau.  Trnh t?t c? khi, th?o d??c, r??u v ma ty khng ???c k ??n. Cc ch?t ha h?c ny ?nh h??ng ??n s? hnh thnh v pht tri?n c?a em b.  Khng s? d?ng b?t k? s?n ph?m thu?c l no, ch?ng ha?n thu?c l d?ng ht, thu?c l d?ng nhai v thu?c l ?i?n t?. N?u qu v? c?n gip ?? ?? cai thu?c, hy h?i chuyn gia ch?m sc s?c kh?e.  Hy lin l?c v?i chuyn gia ch?m sc s?c kh?e n?u qu v? c b?t k? th?c m?c no. Tun th? t?t c? cc cu?c h?n khm tr??c khi sinh theo ch? d?n c?a chuyn gia ch?m sc s?c  kh?e. ?i?u ny c vai tr quan tr?ng. Thng tin ny khng nh?m m?c ?ch thay th? cho l?i khuyn m chuyn gia ch?m sc s?c kh?e ni v?i qu v?. Hy b?o ??m qu v? ph?i th?o lu?n b?t k? v?n ?? g m qu v? c v?i chuyn gia ch?m sc s?c kh?e c?a qu v?. Document Released: 03/16/2015 Document Revised: 06/30/2016 Document Reviewed: 06/30/2016 Elsevier Interactive Patient Education  2018 [employer]  

## 2017-12-29 NOTE — Progress Notes (Signed)
s  PRENATAL VISIT NOTE  Subjective:  Jacqueline Gates is a 27 y.o. G2P1001 at [redacted]w[redacted]d being seen today for ongoing prenatal care.  She is currently monitored for the following issues for this low-risk pregnancy and has Language barrier and Supervision of other normal pregnancy, antepartum on their problem list.  Patient reports no complaints.  Contractions: Not present. Vag. Bleeding: None.  Movement: Present. Denies leaking of fluid.   The following portions of the patient's history were reviewed and updated as appropriate: allergies, current medications, past family history, past medical history, past social history, past surgical history and problem list. Problem list updated.  Objective:   Vitals:   12/29/17 0915  BP: (!) 87/55  Pulse: 74  Weight: 49.9 kg    Fetal Status: Fetal Heart Rate (bpm): 154   Movement: Present     General:  Alert, oriented and cooperative. Patient is in no acute distress.  Skin: Skin is warm and dry. No rash noted.   Cardiovascular: Normal heart rate noted  Respiratory: Normal respiratory effort, no problems with respiration noted  Abdomen: Soft, gravid, appropriate for gestational age.  Pain/Pressure: Absent     Pelvic: Cervical exam deferred        Extremities: Normal range of motion.  Edema: None  Mental Status: Normal mood and affect. Normal behavior. Normal judgment and thought content.   Assessment and Plan:  Pregnancy: G2P1001 at [redacted]w[redacted]d  1. Supervision of other normal pregnancy, antepartum --Anticipatory guidance about next visits/weeks of pregnancy given.  2. Language barrier affecting health care --[facility] [place] interpreter used for all communication  3. Encounter for genetic screening ----Pt asked about genetic screening. At initial prenatal visit, pt selected Quad/AFP.  Discussed and offered genetic screening options, including Quad screen/AFP, NIPS testing, and option to decline testing. Benefits/risks/alternatives reviewed. Pt  aware that anatomy US is form of genetic screening with lower accuracy in detecting trisomies than blood work.  Pt chooses NIPS genetic screening today.  - Genetic Screening  Preterm labor symptoms and general obstetric precautions including but not limited to vaginal bleeding, contractions, leaking of fluid and fetal movement were reviewed in detail with the patient. Please refer to After Visit Summary for other counseling recommendations.  No follow-ups on file.  Future Appointments  Date Time Provider Department Center  01/11/2018  7:45 AM [facility] [place] 2 [facility]    Sharen Counter, CNM

## 2017-12-30 ENCOUNTER — Encounter: Payer: Self-pay | Admitting: *Deleted

## 2018-01-04 ENCOUNTER — Encounter (HOSPITAL_COMMUNITY): Payer: Self-pay

## 2018-01-04 ENCOUNTER — Encounter: Payer: Self-pay | Admitting: *Deleted

## 2018-01-04 ENCOUNTER — Telehealth: Payer: Self-pay | Admitting: Family Medicine

## 2018-01-04 NOTE — Telephone Encounter (Signed)
Pt's husband called and stated that they would like the results of the genetic testing. Would like to know if its a boy or girl.

## 2018-01-04 NOTE — Telephone Encounter (Addendum)
I called Jacqueline Gates with [employer] [phone] and left a message I am returning their call and the information you requested is in a sealed envelope to be picked up at the front desk.

## 2018-01-04 NOTE — Telephone Encounter (Signed)
Received a voice message from a female stating he is calling for his wife Jacqueline Gates about test results and ask for a call back.

## 2018-01-06 ENCOUNTER — Telehealth: Payer: Self-pay | Admitting: Family Medicine

## 2018-01-06 NOTE — Telephone Encounter (Signed)
Pt's husband has called several times wanting to know his wife's blood test results. Left a message on the after hours line on 11/5 wanting to get the results as well. Can you please reach out to them.

## 2018-01-06 NOTE — Telephone Encounter (Signed)
Called patient with [employer] [phone], no answer- left message stating we are trying to reach you to return your phone call. Your test results were normal. You may call us back if you have questions.

## 2018-01-11 ENCOUNTER — Other Ambulatory Visit (HOSPITAL_COMMUNITY): Payer: Self-pay | Admitting: *Deleted

## 2018-01-11 ENCOUNTER — Ambulatory Visit (HOSPITAL_COMMUNITY)
Admission: RE | Admit: 2018-01-11 | Discharge: 2018-01-11 | Disposition: A | Discharge status: Home / Self Care | Payer: BLUE CROSS/BLUE SHIELD | Source: Ambulatory Visit | Attending: Advanced Practice Midwife | Admitting: Advanced Practice Midwife

## 2018-01-11 DIAGNOSIS — Z3482 Encounter for supervision of other normal pregnancy, second trimester: Secondary | ICD-10-CM | POA: Diagnosis present

## 2018-01-11 DIAGNOSIS — Z363 Encounter for antenatal screening for malformations: Secondary | ICD-10-CM | POA: Diagnosis not present

## 2018-01-11 DIAGNOSIS — Z3689 Encounter for other specified antenatal screening: Secondary | ICD-10-CM

## 2018-01-11 DIAGNOSIS — Z3A22 22 weeks gestation of pregnancy: Secondary | ICD-10-CM | POA: Diagnosis not present

## 2018-01-11 DIAGNOSIS — Z3480 Encounter for supervision of other normal pregnancy, unspecified trimester: Secondary | ICD-10-CM

## 2018-01-25 ENCOUNTER — Encounter: Payer: Self-pay | Admitting: Advanced Practice Midwife

## 2018-02-01 ENCOUNTER — Encounter: Payer: Self-pay | Admitting: Advanced Practice Midwife

## 2018-02-01 ENCOUNTER — Telehealth: Payer: Self-pay | Admitting: Family Medicine

## 2018-02-01 NOTE — Telephone Encounter (Signed)
Husband is sick, and can not bring his wife in today. He refused everyday I suggested due to his working. He will come in on 03/04/2018 for her to be seen.

## 2018-02-08 ENCOUNTER — Ambulatory Visit (HOSPITAL_COMMUNITY)
Admission: RE | Admit: 2018-02-08 | Discharge: 2018-02-08 | Disposition: A | Discharge status: Home / Self Care | Payer: BLUE CROSS/BLUE SHIELD | Source: Ambulatory Visit | Attending: Advanced Practice Midwife | Admitting: Advanced Practice Midwife

## 2018-02-08 DIAGNOSIS — Z3689 Encounter for other specified antenatal screening: Secondary | ICD-10-CM

## 2018-02-08 DIAGNOSIS — Z3A26 26 weeks gestation of pregnancy: Secondary | ICD-10-CM

## 2018-02-08 DIAGNOSIS — Z3687 Encounter for antenatal screening for uncertain dates: Secondary | ICD-10-CM | POA: Diagnosis not present

## 2018-02-08 DIAGNOSIS — Z362 Encounter for other antenatal screening follow-up: Secondary | ICD-10-CM | POA: Insufficient documentation

## 2018-03-03 NOTE — L&D Delivery Note (Signed)
Patient: Jacqueline Gates MRN: [id number]  GBS status: Positive, IAP given: PCN x1   Patient is a 28 y.o. now G2P2 s/p NSVD at [redacted]w[redacted]d, who was admitted for SOL. SROM 0h 49m prior to delivery with clear fluid.    Delivery Note At 4:01 PM a viable female was delivered via Vaginal, Spontaneous (Presentation: ROA).  APGAR: 9, 9; weight 6 lb 11.4 oz (3045 g).   Placenta status: spontaneous, intact.  Cord: 3 vessel with the following complications: none.   Anesthesia:  Epidural  Episiotomy: None Lacerations: None Suture Repair: None  Est. Blood Loss (mL): 248  Head delivered ROA. No nuchal cord present. Shoulder and body delivered in usual fashion. Infant with spontaneous cry, placed on mother's abdomen, dried and bulb suctioned. Cord clamped x 2 after 1-minute delay, and cut by provider. Cord blood drawn. Placenta delivered spontaneously with gentle cord traction. Fundus firm with massage and Pitocin. Perineum inspected and found to have no lacerations.  Mom to postpartum.  Baby to Couplet care / Skin to Skin.  De Hollingshead 04/28/2018, 5:47 PM

## 2018-03-04 ENCOUNTER — Ambulatory Visit (INDEPENDENT_AMBULATORY_CARE_PROVIDER_SITE_OTHER): Payer: BLUE CROSS/BLUE SHIELD | Admitting: Obstetrics and Gynecology

## 2018-03-04 DIAGNOSIS — Z3480 Encounter for supervision of other normal pregnancy, unspecified trimester: Secondary | ICD-10-CM

## 2018-03-04 DIAGNOSIS — Z23 Encounter for immunization: Secondary | ICD-10-CM | POA: Diagnosis not present

## 2018-03-04 DIAGNOSIS — Z3483 Encounter for supervision of other normal pregnancy, third trimester: Secondary | ICD-10-CM

## 2018-03-04 MED ORDER — PRENATAL GUMMIES/DHA & FA 0.4-32.5 MG PO CHEW: 1.0000 | CHEWABLE_TABLET | Freq: Every day | ORAL | 4 refills | Status: AC

## 2018-03-04 NOTE — Patient Instructions (Signed)
V??c xin Tdap (u?n vn, b?ch h?u v ho g): Quy? vi? c?n bi?t ?i?u gi?  Tdap Vaccine (Tetanus, Diphtheria and Pertussis): What You Need to Know  1. T?i sao pha?i tim v??c xin?  U?n vn, b?ch h?u v ho g la? nh??ng b?nh r?t nghim tro?ng. V??c xin Tdap c th? b?o v? chng ta kh?i b? nh?ng b?nh na?y. V, v??c xin Tdap tim cho ph? n? mang thai c th? b?o v? tr? s? sinh kho?i b? ho g.  U?N VN (Khi?t ha?m) ngy nay hi?m g??p ? [place] B?nh gy ra c?ng va? c??ng c? gy ?au ???n, th???ng la? kh?p c? th?.   B?nh c th? d?n ??n c?ng c? ? ??u v c?, v v?y, quy? vi? khng th? m? mi?ng, nu?t, ho?c th?m chi? ?i khi khng th? th? ???c. U?n vn gy t?? vong kho?ng 1 trong s? 10 ng??i bi? nhi?m b?nh ngay ca? sau khi ???c ch?m sc y t? t?t nh?t.  BA?CH H?U ngy nay c?ng hi?m g??p ? [place] B?nh c th? gy ra m?t l?p ph? dy hi?nh tha?nh ? thnh sau h?ng.   B?nh c th? d?n ??n ca?c v?n ?? v? th?, suy tim, li?t v t?? vong.  HO GA? (ho ga?) gy ra nh??ng c?n ho n?ng, c th? gy kh th?, nn m?a v a?nh h???ng ??n gi?c ng?.   B?nh c?ng c th? d?n ??n s?t cn, khng t?? chu? v gy x??ng s??n. Co? ??n 2 trong s? 100 thanh thi?u nin v 5 trong s? 100 ng???i l??n b? ho g pha?i n?m vi?n ho?c c cc bi?n ch?ng, c th? bao g?m vim ph?i ho?c t? vong.  Nh??ng b?nh ny do vi khu?n gy ra. B?nh b?ch h?u v ho g ly truy?n t? ng??i sang ng??i qua d?ch ti?t do ho ho?c h?t h?i. U?n vn xm nh?p c? th? qua v?t c?t, v?t tr?y x??c ho?c v?t th??ng.  Tr??c khi co? v?c-xin, co? t??i 200.000 tr???ng h??p b?nh b?ch h?u, 200.000 tr??ng h?p ho g v hng tr?m tr??ng h?p b?nh u?n vn ? ???c bo co t?i [place]? m?i n?m. K? t? khi b?t ??u tim v?c xin, bo co v? cc tr??ng h?p b?nh u?n vn v b?ch h?u ? gi?m kho?ng 99% v ho g gia?m kho?ng 80%.  2. V?c xin Tdap  V??c xin Tdap c th? b?o v? thanh thi?u nin v ng??i l?n kho?i b?nh u?n vn, b?ch h?u va? ho g. M?t li?u Tdap th??ng ????c tim ? tu?i 11 ho?c 12. Nh??ng ng??i khng  ???c tim Tdap ? ?? tu?i ?o? c?n ????c tim cng s?m cng t?t.  Tdap c vai tr ??c bi?t quan tr?ng v?i cc chuyn gia ch?m sc s?c kh?e v b?t c? ai c ti?p xu?c g?n gu?i v?i m?t em b d??i 12 thng tu?i.  Ph? n? mang thai nn ???c tim m?t li?u Tdap trong m?i l?n mang thai ?? b?o v? tr? s? sinh kho?i b? ho g. Tr? nho? co? nguy c? cao nh?t bi? cc bi?n ch?ng n?ng, ?e d?a ma?ng s?ng do ho g.  M?t lo?i v?c xin khc, c tn l Td, b?o v? kh?i b? b?nh u?n vn v b?ch h?u, nh?ng khng c ho g. Nn tim m?t li?u Td t?ng c???ng 10 n?m m?t l?n. Tdap c th? ???c tim nh? m?t trong nh??ng li?u t?ng c???ng na?y n?u quy? vi? ch?a ???c tim Tdap tr??c ?y. Tdap c?ng c th? ???c tim sau khi   bi? m?t v?t c?t ho?c v?t bo?ng n?ng ?? ng?n ng?a nhi?m b?nh u?n vn.  Bc s? c?a quy? vi? ho?c ng??i tim v?c xin cho quy? vi? c th? cung c?p cho quy? vi? thm thng tin.  Tdap co? th? ????c tim m?t cch an ton cng m?t lc nh? nh??ng v?c xin khc.  3. M?t s? ng???i khng nn tim v??c xin na?y   M?t ng??i ? t?ng c m?t ph?n ?ng d? ?ng ?e do?a ti?nh ma?ng sau khi tim m?t li?u c?a b?t c? v??c xin b?ch h?u, u?n vn ho?c ho g no tr??c ?, HO??C c d? ?ng n?ng v?i b?t k? tha?nh ph?n no c?a v?c xin ny, th khng ????c tim v??c xin Tdap. Cho ng??i tim v?c xin bi?t v? b?t k? d? ?ng n?ng no.   B?t c? ai ?a? bi? hn m ho?c co gi?t ke?o da?i l?p ?i l?p l?i trong vng 7 ngy sau khi tim m?t li?u DTP ho?c DTaP dnh cho tr? nho?, ho?c m?t li?u Tdap tr??c ?y th khng nn tim Tdap, tr? khi m?t nguyn nhn khc ????c pha?t hi?n khng pha?i do v?c-xin. H? v?n c th? ???c tim Td.   Hy cho bc s? bi?t n?u quy? vi?:  ? bi? co gi?t ho?c b? m?t v?n ?? kha?c ? h? th?n kinh,  ? bi? ?au ho??c s?ng r?t nhi?u sau khi tim b?t k? v?c xin no c b?ch h?u, u?n vn ho?c ho g,  ? t??ng bi? m?t ti?nh tra?ng g?i l h?i ch?ng Guillain-Barr (GBS),  ? khng ca?m th?y kho?e vo ngy c l?ch tim.  4. Nguy c?  V?i b?t k? loa?i  thu?c na?o, bao g?m c? v?c xin, ??u c nguy c? co? tc d?ng ph?. Nh??ng ta?c du?ng phu? na?y th??ng la? nh? v t?? h?t. Nh??ng ph?n ?ng nghim tr?ng c?ng c th? c nh?ng r?t hi?m.  H?u h?t nh?ng ng??i ???c tim v?c xin Tdap khng c b?t k? v?n ?? gi? v?i v?c xin ny.  Nh??ng v?n ?? nh? sau khi tim Tdap  (Khng gy ca?n tr?? sinh ho?t)   ?au ?? ch? tim (kho?ng 3 trong s? 4 thanh thi?u nin ho?c 2 trong s? 3 ng??i l?n)   ?? ho?c s?ng ?? ch? tim (kho?ng 1 trong s? 5 ng???i)   S?t nh? t nh?t 100,4 F (t?i ?a kho?ng 1 trong s? 25 thanh thi?u nin ho?c 1 trong s? 100 ng??i l?n)   Nh?c ??u (kho?ng 3 ho?c 4 trong s? 10 ng???i)   M?t m?i (kho?ng 1 trong s? 3 ho?c 4 ng???i)   Bu?n nn, nn m??a, tiu cha?y, ?au bu?ng (t?i ?a 1 trong s? 4 thanh thi?u nin ho??c 1 trong s? 10 ng???i l??n)   ?n l?nh, ?au kh?p (kho?ng 1 trong s? 10 ng???i)   ?au nh?c c? th? (kho?ng 1 trong s? 3 ho?c 4 ng???i)   Pha?t ban, s?ng ha?ch (b?t th??ng)  Nh??ng v?n ?? v?a ph?i sau khi tim Tdap  (Gy ca?n tr?? sinh ho?t, nh?ng khng ?i h?i ph?i c ch?m sc y t?)   ?au ?? ch? tim (t?i ?a 1 trong s? 5 hay 6 ng???i)   ?? ho?c s?ng ?? ch? tim (t?i ?a kho?ng 1 trong s? 16 thanh thi?u nin ho?c 1 trong s? 12 ng??i l?n)   S?t trn 102F (khoa?ng 1 trong s? 100 thanh thi?u nin ho??c 1 trong s? 250 ng???i l??n)   Nh?c ??u (kho?ng 1 trong s?   7 thanh thi?u nin ho?c 1 trong s? 10 ng??i l?n)   Bu?n nn, nn m?a, tiu ch?y, ?au b?ng (t?i ?a 1 ho?c 3 trong s? 100 ng???i)   S?ng toa?n b? cnh tay bi? tim (t?i ?a kho?ng 1 trong s? 500 ng???i).  Nh??ng v?n ?? n?ng sau khi tim Tdap  (Khng th? th?c hi?n sinh ho?t bnh th??ng; c?n pha?i ch?m sc y t?)   S?ng, ?au r?t nhi?u, ch?y mu v t?y ?? ? cnh tay bi? tim (hi?m g??p).  Nh??ng v?n ?? c th? x?y ra sau khi tim b?t k? v?c xin na?o:   ?i khi c ng??i bi? ng?t sau khi th?c hi?n m?t thu? thu?t y khoa, bao g?m c? tim v?c xin. Ng?i ho?c n?m xu?ng kho?ng 15  pht c th? gip ng?n ng?a bi? ng?t v ch?n th??ng do nga?. Cho bc s? bi?t n?u quy? vi? c?m th?y chng m?t, ho?c c nh?ng thay ??i th? l?c ho?c  tai.   M?t s? ng??i bi? ?au d? d?i ? vai v kh c?? ??ng cnh tay ? ???c tim. ?i?u ny r?t hi?m khi x?y ra.   B?t k? thu?c na?o cu?ng c th? gy ra m?t ph?n ?ng d? ?ng n?ng. Nh?ng ph?n ?ng nh? v?y do m?t v?c xin l r?t hi?m, ??c tnh co? t h?n 1 trong s? m?t tri?u li?u v s? x?y ra trong vng m?t vi pht ??n vi gi? sau khi tim v?c xin.  Nh? v?i b?t k? thu?c na?o, co? m?t kha? n?ng r?t nho? l m?t v?c xin gy ra th??ng t?n nghim tr?ng ho?c t? vong.  Lun theo di ?? an ton c?a v?c xin. ?? bi?t thm thng tin, hy truy c?p: www.cdc.gov/vaccinesafety/  5. N?u co? m?t v?n ?? nghim tr?ng thi? sao?  Ti c?n tm g?   Tm b?t c? ?i?u g la?m quy? vi? lo l??ng, ch?ng h?n nh? cc d?u hi?u c?a m?t ph?n ?ng d? ?ng n?ng, s?t r?t cao ho?c hnh vi b?t th??ng.  D?u hi?u c?a m?t ph?n ?ng d? ?ng n?ng c th? bao g?m pht ban, s?ng m?t v h?ng, kh th?, nh?p ??p c?a tim nhanh, chng m?t v y?u. Nh??ng d?u hi?u na?y th??ng b?t ??u vi pht ??n vi gi? sau khi tim v?c xin.  Ti nn lm g?   N?u quy? vi? ngh? r?ng ? l m?t ph?n ?ng d? ?ng n?ng ho?c tr??ng h?p c?p c?u kha?c khng th? ch? ??i, hy g?i 9-1-1 ho?c ??a ng??i ?o? ??n b?nh vi?n g?n nh?t. N?u khng, hy g?i bc s? c?a quy? vi?.   Sau ?, ph?n ?ng ny c?n ph?i ???c bo ca?o cho Vaccine Adverse Event Reporting System (H? th?ng Ba?o ca?o Bi?n c? B?t l?i c?a V??c xin, VAERS). Bc s? c?a quy? vi? c th? g?i bo co ny, ho?c quy? vi? c th? t?? g??i thng qua trang web cu?a VAERS ta?i www.vaers.hhs.gov, ho??c b??ng ca?ch go?i 1-800-822-7967.  VAERS khng cung c?p t? v?n y t?.  6. The National Vaccine Injury Compensation Program (Ch??ng tri?nh B?i th???ng Th??ng t?t do V??c xin Qu?c gia)  Ch??ng tri?nh B?i th???ng Th??ng t?n do V?c xin Qu?c gia (VICP) l m?t ch??ng trnh lin bang ? ???c ??a ra ?? b?i  th???ng cho nh?ng ng??i ? b? th??ng t?n do loa?i v?c xin no ?.  Nh??ng ng???i tin r??ng ho? co? th? ?a? bi? th??ng t?n do m?t loa?i v??c xin co? th? ti?m hi?u v? ch??ng tri?nh va?   v? ca?ch n?p ??n khi?u na?i b??ng ca?ch go?i 1-800-338-2382 ho??c truy c?p trang web cu?a VICP ta?i www.hrsa.gov/vaccinecompensation. Co? m?t gi?i h?n th?i gian cho vi?c n?p m?t yu c?u b?i th??ng.  7. Ti c th? tm hi?u thm nh? th? na?o?   Hy h?i bc s? c?a quy? vi?. Ba?c si? c th? cung c?p cho quy? vi? t? h??ng d?n s? d?ng trong bao b c?a v?c xin ho??c g?i  cc ngu?n thng tin kha?c.   G?i cho s? y t? ??a ph??ng ho?c ti?u bang c?a quy? vi?.   Lin h? v?i Centers for Disease Control and Prevention (Trung tm Ki?m sot va? Pho?ng ng??a D?ch b?nh, CDC):  ? Go?i 1-800-232-4636 (1-800-CDC-INFO) ho??c  ? Truy c?p vo website c?a CDC t?i www.cdc.gov/vaccines  B?n k Thng tin V?c xin c?a V?c xin Tdap (04/26/2013)  Thng tin ny khng nh?m m?c ?ch thay th? cho l?i khuyn m chuyn gia ch?m sc s?c kh?e ni v?i qu v?. Hy b?o ??m qu v? ph?i th?o lu?n b?t k? v?n ?? g m qu v? c v?i chuyn gia ch?m sc s?c kh?e c?a qu v?.  Document Released: 06/11/2015 Document Revised: 10/21/2017 Document Reviewed: 10/21/2017  Elsevier Interactive Patient Education  2019 Elsevier Inc[employer]

## 2018-03-04 NOTE — Progress Notes (Signed)
   PRENATAL VISIT NOTE  Subjective:  Jacqueline Gates is a 28 y.o. G2P1001 at [redacted]w[redacted]d being seen today for ongoing prenatal care.  She is currently monitored for the following issues for this low-risk pregnancy and has Language barrier and Supervision of other normal pregnancy, antepartum on their problem list.  Patient reports no complaints.  Contractions: Not present. Vag. Bleeding: None.  Movement: Present. Denies leaking of fluid.   The following portions of the patient's history were reviewed and updated as appropriate: allergies, current medications, past family history, past medical history, past social history, past surgical history and problem list. Problem list updated.  Objective:   Vitals:   03/04/18 0816  BP: 98/69  Pulse: 85  Weight: 118 lb 6.4 oz (53.7 kg)    Fetal Status: Fetal Heart Rate (bpm): 142 Fundal Height: 30 cm Movement: Present     General:  Alert, oriented and cooperative. Patient is in no acute distress.  Skin: Skin is warm and dry. No rash noted.   Cardiovascular: Normal heart rate noted  Respiratory: Normal respiratory effort, no problems with respiration noted  Abdomen: Soft, gravid, appropriate for gestational age.  Pain/Pressure: Present     Pelvic: Cervical exam deferred        Extremities: Normal range of motion.  Edema: None  Mental Status: Normal mood and affect. Normal behavior. Normal judgment and thought content.   Assessment and Plan:  Pregnancy: G2P1001 at [redacted]w[redacted]d  1. Supervision of other normal pregnancy, antepartum  - doing well  - patient coming back tomorrow AM to do 2 hour GTT and 3rd trimester labs - Missed appointments    There are no diagnoses linked to this encounter. Preterm labor symptoms and general obstetric precautions including but not limited to vaginal bleeding, contractions, leaking of fluid and fetal movement were reviewed in detail with the patient. Please refer to After Visit Summary for other counseling  recommendations.  Return in about 1 day (around 03/05/2018) for Please schedule 2 hour GTT tomorrow at 8:15.  Future Appointments  Date Time Provider Department Center  03/05/2018  8:20 AM [facility]  03/18/2018  9:35 AM Delonta Yohannes, Harolyn Rutherford, NP [facility]    Venia Carbon, NP

## 2018-03-05 ENCOUNTER — Other Ambulatory Visit: Payer: Self-pay | Admitting: Obstetrics and Gynecology

## 2018-03-05 ENCOUNTER — Other Ambulatory Visit: Payer: BLUE CROSS/BLUE SHIELD

## 2018-03-05 DIAGNOSIS — Z3480 Encounter for supervision of other normal pregnancy, unspecified trimester: Secondary | ICD-10-CM

## 2018-03-05 NOTE — Progress Notes (Signed)
Pt vomitted before 1hr GTT labs. Please reschedule.  [facility] CPT  Phlebotomist

## 2018-03-08 LAB — GLUCOSE TOLERANCE, 2 HOURS W/ 1HR: Glucose, Fasting: 74 mg/dL (ref 65–91)

## 2018-03-08 LAB — CBC
HEMATOCRIT: 34 % (ref 34.0–46.6)
Hemoglobin: 11.2 g/dL (ref 11.1–15.9)
MCH: 27 pg (ref 26.6–33.0)
MCHC: 32.9 g/dL (ref 31.5–35.7)
MCV: 82 fL (ref 79–97)
Platelets: 251 10*3/uL (ref 150–450)
RBC: 4.15 x10E6/uL (ref 3.77–5.28)
RDW: 13.6 % (ref 12.3–15.4)
WBC: 7.7 10*3/uL (ref 3.4–10.8)

## 2018-03-08 LAB — RPR: RPR Ser Ql: NONREACTIVE

## 2018-03-08 LAB — HIV ANTIBODY (ROUTINE TESTING W REFLEX): HIV Screen 4th Generation wRfx: NONREACTIVE

## 2018-03-09 ENCOUNTER — Other Ambulatory Visit: Payer: BLUE CROSS/BLUE SHIELD

## 2018-03-09 DIAGNOSIS — Z3480 Encounter for supervision of other normal pregnancy, unspecified trimester: Secondary | ICD-10-CM

## 2018-03-10 LAB — GLUCOSE TOLERANCE, 2 HOURS
Glucose, 2 hour: 112 mg/dL (ref 65–139)
Glucose, GTT - Fasting: 69 mg/dL (ref 65–99)

## 2018-03-18 ENCOUNTER — Ambulatory Visit (INDEPENDENT_AMBULATORY_CARE_PROVIDER_SITE_OTHER): Payer: Self-pay | Admitting: Obstetrics and Gynecology

## 2018-03-18 VITALS — BP 93/66 | HR 96 | Wt 120.0 lb

## 2018-03-18 DIAGNOSIS — Z3A32 32 weeks gestation of pregnancy: Secondary | ICD-10-CM

## 2018-03-18 DIAGNOSIS — Z789 Other specified health status: Secondary | ICD-10-CM

## 2018-03-18 DIAGNOSIS — Z3483 Encounter for supervision of other normal pregnancy, third trimester: Secondary | ICD-10-CM

## 2018-03-18 DIAGNOSIS — Z3480 Encounter for supervision of other normal pregnancy, unspecified trimester: Secondary | ICD-10-CM

## 2018-03-18 MED ORDER — PROMETHAZINE-DM 6.25-15 MG/5ML PO SYRP: 5.0000 mL | ORAL_SOLUTION | Freq: Four times a day (QID) | ORAL | 0 refills | Status: DC | PRN

## 2018-03-18 NOTE — Progress Notes (Signed)
   PRENATAL VISIT NOTE  Subjective:  Jacqueline Gates is a 28 y.o. G2P1001 at [redacted]w[redacted]d being seen today for ongoing prenatal care.  She is currently monitored for the following issues for this low-risk pregnancy and has Language barrier and Supervision of other normal pregnancy, antepartum on their problem list.  Patient reports no complaints.  Contractions: Not present. Vag. Bleeding: None.  Movement: Present. Denies leaking of fluid.   The following portions of the patient's history were reviewed and updated as appropriate: allergies, current medications, past family history, past medical history, past social history, past surgical history and problem list. Problem list updated.  Objective:   Vitals:   03/18/18 0944  BP: 93/66  Pulse: 96  Weight: 120 lb (54.4 kg)    Fetal Status:   Fundal Height: 32 cm Movement: Present     General:  Alert, oriented and cooperative. Patient is in no acute distress.  Skin: Skin is warm and dry. No rash noted.   Cardiovascular: Normal heart rate noted  Respiratory: Normal respiratory effort, no problems with respiration noted, CTA   Abdomen: Soft, gravid, appropriate for gestational age.  Pain/Pressure: Present     Pelvic: Cervical exam deferred        Extremities: Normal range of motion.  Edema: None  Mental Status: Normal mood and affect. Normal behavior. Normal judgment and thought content.   Assessment and Plan:  Pregnancy: G2P1001 at [redacted]w[redacted]d  1. Language barrier  - Interpretor used.   2. Supervision of other normal pregnancy, antepartum  Doing well. Cough for 5 days. Has been taking antihistamine. List of OTC medications given. Rx: Phenergan cough medication. Call if symptoms worsen; fever etc.   There are no diagnoses linked to this encounter. Preterm labor symptoms and general obstetric precautions including but not limited to vaginal bleeding, contractions, leaking of fluid and fetal movement were reviewed in detail with the  patient. Please refer to After Visit Summary for other counseling recommendations.  Return in about 2 weeks (around 04/01/2018).    Future Appointments  Date Time Provider Department Center  04/05/2018 10:55 AM Armando Reichert, CNM [facility] WOC    Venia Carbon, NP

## 2018-03-18 NOTE — Patient Instructions (Signed)

## 2018-04-05 ENCOUNTER — Ambulatory Visit (INDEPENDENT_AMBULATORY_CARE_PROVIDER_SITE_OTHER): Payer: Self-pay | Admitting: Advanced Practice Midwife

## 2018-04-05 ENCOUNTER — Encounter: Payer: Self-pay | Admitting: Advanced Practice Midwife

## 2018-04-05 VITALS — BP 93/66 | HR 87 | Wt 125.1 lb

## 2018-04-05 DIAGNOSIS — Z3480 Encounter for supervision of other normal pregnancy, unspecified trimester: Secondary | ICD-10-CM

## 2018-04-05 DIAGNOSIS — Z3483 Encounter for supervision of other normal pregnancy, third trimester: Secondary | ICD-10-CM

## 2018-04-05 MED ORDER — DIPHENHYDRAMINE HCL 25 MG PO TABS: 25.0000 mg | ORAL_TABLET | Freq: Four times a day (QID) | ORAL | 1 refills | Status: DC | PRN

## 2018-04-05 NOTE — Progress Notes (Signed)
   PRENATAL VISIT NOTE  Subjective:  Jacqueline Gates is a 28 y.o. G2P1001 at [redacted]w[redacted]d being seen today for ongoing prenatal care.  She is currently monitored for the following issues for this low-risk pregnancy and has Language barrier and Supervision of other normal pregnancy, antepartum on their problem list.  Patient reports pelvic pressure, some Braxton-Hicks, and rash on her arms that is worst after showering.  Contractions: Irritability. Vag. Bleeding: None.  Movement: Present. Denies leaking of fluid. Has seen PCP for rash on arms, was given hydrocortisone cream which relieves itch well. Has not recently changed soaps, lotion, detergent, or diet.   The following portions of the patient's history were reviewed and updated as appropriate: allergies, current medications, past family history, past medical history, past social history, past surgical history and problem list. Problem list updated.  Interpreter #: [id number] Objective:   Vitals:   04/05/18 1100  BP: 93/66  Pulse: 87  Weight: 56.7 kg    Fetal Status: Fetal Heart Rate (bpm): 148 Fundal Height: 34 cm Movement: Present     General:  Alert, oriented and cooperative. Patient is in no acute distress.  Skin: Skin is warm and dry. Urticaria noted on arms, none on abdomen or around umbilicus.  Cardiovascular: Normal heart rate noted  Respiratory: Normal respiratory effort, no problems with respiration noted  Abdomen: Soft, gravid, appropriate for gestational age.  Pain/Pressure: Present     Pelvic: Cervical exam deferred        Extremities: Normal range of motion.  Edema: None  Mental Status: Normal mood and affect. Normal behavior. Normal judgment and thought content.   Assessment and Plan:  Pregnancy: G2P1001 at [redacted]w[redacted]d  1. Supervision of other normal pregnancy, antepartum - Glucose Tolerance, 1 Hour - Bile acids, total - Hepatic function panel - Watch rash on arms; Benadryl prescribed in case hydrocortisone cream stops  working.  - Watch FH  Preterm labor symptoms and general obstetric precautions including but not limited to vaginal bleeding, contractions, leaking of fluid and fetal movement were reviewed in detail with the patient.  Please refer to After Visit Summary for other counseling recommendations.  Return in about 2 weeks (around 04/19/2018).  Bernerd Limbo, Student-MidWife

## 2018-04-06 LAB — BILE ACIDS, TOTAL: Bile Acids Total: 3.7 umol/L (ref 0.0–10.0)

## 2018-04-06 LAB — HEPATIC FUNCTION PANEL
ALT: 9 IU/L (ref 0–32)
AST: 18 IU/L (ref 0–40)
Albumin: 3.4 g/dL — ABNORMAL LOW (ref 3.9–5.0)
Alkaline Phosphatase: 193 IU/L — ABNORMAL HIGH (ref 39–117)
Bilirubin Total: 0.2 mg/dL (ref 0.0–1.2)
Bilirubin, Direct: 0.08 mg/dL (ref 0.00–0.40)
Total Protein: 6.5 g/dL (ref 6.0–8.5)

## 2018-04-06 LAB — GLUCOSE TOLERANCE, 1 HOUR: Glucose, 1Hr PP: 111 mg/dL (ref 65–199)

## 2018-04-16 ENCOUNTER — Inpatient Hospital Stay (HOSPITAL_COMMUNITY)
Admission: AD | Admit: 2018-04-16 | Discharge: 2018-04-16 | Disposition: A | Discharge status: Home / Self Care | Payer: BLUE CROSS/BLUE SHIELD | Attending: Obstetrics & Gynecology | Admitting: Obstetrics & Gynecology

## 2018-04-16 ENCOUNTER — Encounter (HOSPITAL_COMMUNITY): Payer: Self-pay

## 2018-04-16 DIAGNOSIS — Z3480 Encounter for supervision of other normal pregnancy, unspecified trimester: Secondary | ICD-10-CM

## 2018-04-16 DIAGNOSIS — B373 Candidiasis of vulva and vagina: Secondary | ICD-10-CM | POA: Diagnosis not present

## 2018-04-16 DIAGNOSIS — N898 Other specified noninflammatory disorders of vagina: Secondary | ICD-10-CM | POA: Insufficient documentation

## 2018-04-16 DIAGNOSIS — O98813 Other maternal infectious and parasitic diseases complicating pregnancy, third trimester: Secondary | ICD-10-CM | POA: Diagnosis not present

## 2018-04-16 DIAGNOSIS — R109 Unspecified abdominal pain: Secondary | ICD-10-CM | POA: Insufficient documentation

## 2018-04-16 DIAGNOSIS — Z3A36 36 weeks gestation of pregnancy: Secondary | ICD-10-CM

## 2018-04-16 DIAGNOSIS — O26893 Other specified pregnancy related conditions, third trimester: Secondary | ICD-10-CM | POA: Diagnosis not present

## 2018-04-16 DIAGNOSIS — O4703 False labor before 37 completed weeks of gestation, third trimester: Secondary | ICD-10-CM | POA: Diagnosis not present

## 2018-04-16 DIAGNOSIS — O4700 False labor before 37 completed weeks of gestation, unspecified trimester: Secondary | ICD-10-CM

## 2018-04-16 DIAGNOSIS — B3731 Acute candidiasis of vulva and vagina: Secondary | ICD-10-CM

## 2018-04-16 LAB — WET PREP, GENITAL
CLUE CELLS WET PREP: NONE SEEN
Sperm: NONE SEEN
Trich, Wet Prep: NONE SEEN

## 2018-04-16 LAB — URINALYSIS, ROUTINE W REFLEX MICROSCOPIC
Bilirubin Urine: NEGATIVE
Glucose, UA: NEGATIVE mg/dL
Hgb urine dipstick: NEGATIVE
Ketones, ur: NEGATIVE mg/dL
Nitrite: NEGATIVE
Protein, ur: NEGATIVE mg/dL
pH: 6.5 (ref 5.0–8.0)

## 2018-04-16 LAB — URINALYSIS, MICROSCOPIC (REFLEX)

## 2018-04-16 MED ORDER — ACETAMINOPHEN 500 MG PO TABS
1000.0000 mg | ORAL_TABLET | Freq: Once | ORAL | Status: AC
  Administered 2018-04-16: 1000 mg via ORAL
  Filled 2018-04-16: qty 2

## 2018-04-16 MED ORDER — TERCONAZOLE 0.4 % VA CREA: 1.0000 | TOPICAL_CREAM | Freq: Every day | VAGINAL | 0 refills | Status: DC

## 2018-04-16 MED ORDER — NIFEDIPINE 10 MG PO CAPS
10.0000 mg | ORAL_CAPSULE | ORAL | Status: DC | PRN
  Administered 2018-04-16 (×2): 10 mg via ORAL
  Filled 2018-04-16 (×2): qty 1

## 2018-04-16 NOTE — Discharge Instructions (Signed)
Nhi?m n?m m ??o, Ng??i l?n Vaginal Yeast infection, Adult  Nhi?m n?m m ??o l m?t tnh tr?ng b?nh l gy ra kh h? ? m ??o c?ng nh? ?au nh?c, s?ng t?y v ?? (vim) ? m ??o. ?y l m?t tnh tr?ng ph? bi?n. M?t s? ph? n? th??ng xuyn b? nhi?m b?nh ny. Nguyn nhn g gy ra? Tnh tr?ng ny x?y ra do thay ??i s? cn b?ng bnh th??ng c?a n?m men (candida) v vi khu?n s?ng trong m ??o. Thay ??i ny lm cho n?m men pht tri?n qu m?c, gy vim nhi?m. ?i?u g lm t?ng nguy c?? Tnh tr?ng ny hay x?y ra h?n ? nh?ng ph? n?:  Dng thu?c khng sinh.  Co? b?nh ti?u ????ng.  Dng thu?c vin trnh [place].  Co? [place].  Th?t r?a th??ng xuyn.  C h? th?ng b?o v? c? th? (h? mi?n d?ch) y?u.  ? dng thu?c steroid trong th?i gian di.  Th??ng m?c ?? ch?t. Cc d?u hi?u ho?c tri?u ch?ng l g? Nh?ng tri?u ch?ng c?a tnh tr?ng ny bao g?m:  Kh h? tr?ng, ??c, m?n ? m ??o.  S?ng n?, ng?a, ?? v kch ?ng ? m ??o. Cc mi m ??o (m h?) c?ng c th? b? ?nh h??ng.  ?au ho?c c?m gic b?ng rt khi ?i ti?u.  ?au khi quan h? tnh d?c. Ch?n ?on tnh tr?ng ny nh? th? no? Tnh tr?ng ny ???c ch?n ?on d?a vo:  B?nh s? c?a qu v?.  Khm th?c th?.  Khm vng ch?u. Chuyn gia ch?m [place] s?c kh?e s? ki?m tra m?t m?u kh h? ? m ??o c?a qu v? d??i knh hi?n vi. Chuyn gia ch?m [place] s?c kh?e c th? g?i m?u ny ?i xt nghi?m ?? xc ??nh ch?n ?on. Tnh tr?ng ny ???c ?i?u tr? nh? th? no? Tnh tr?ng ny ???c ?i?u tr? b?ng thu?c. Thu?c c th? l lo?i khng k ??n ho?c k ??n. Qu v? c th? ???c cho dng m?t ho?c nhi?u lo?i thu?c sau:  Thu?c ???c dng qua ???ng mi?ng (???ng u?ng).  Thu?c bi d?ng kem (t?i ch?).  Thu?c nht tr?c ti?p vo m ??o (thu?c ??n). Tun th? nh?ng h??ng d?n ny ? nh:  L?i s?ng  Khng quan h? tnh d?c cho ??n khi chuyn gia ch?m [place] s?c kh?e ch?p thu?n. Ni v?i b?n tnh c?a qu v? r?ng qu v? b? nhi?m n?m men. Ng??i ? c?n ph?i ??n g?p chuyn gia ch?m [place] s?c kh?e v h?i  xem h? c c?n ???c ?i?u tr? hay khng.  Khng m?c ?? ch?t, ch?ng h?n nh? qu?n t?t ho?c qu?n ch?t.  M?c ?? lt b?ng v?i cotton thong kh. H??ng d?n chung  Ch? dng ho?c bi thu?c khng k ??n v thu?c k ??n theo ch? d?n c?a chuyn gia ch?m [place] s?c kh?e.  ?n nhi?u s?a chua. Vi?c ny gip ng?n ng?a nhi?m n?m men ti pht.  Khng s?? du?ng nu?t g?c cho ??n khi ???c chuyn gia ch?m so?c s??c kho?e ch?p thu?n.  Th? ngm mng ?? gip lm gi?m c?m gic kh ch?u. Ngm mng l l?y n??c ?m vo b?n t?m trong khi qu v? ng?i xu?ng. N??c ch? c?n ng?p ??n hng v c?n ph?i ng?p mng qu v?. Lm nh? v?y 3-4 l?n m?i ngy ho?c theo ch? d?n c?a chuyn gia ch?m [place] s?c kh?e.  Khng th?t r?a.  N?u qu v? b? ti?u ???ng, gi? m?c ???ng huy?t c?a qu v? trong t?m ki?m sot.  Tun th? t?t c? cc l?n khm theo di theo ch? d?n c?a chuyn gia ch?m [place] s?c kh?e. ?i?u ny c vai tr quan tr?ng. Hy lin l?c v?i chuyn gia ch?m [place] s?c kh?e n?u:  Qu v? b? s?t.  Cc tri?u ch?ng h?t v sau ? ti pht.  Cc tri?u ch?ng c?a qu v? khng ?? h?n sau khi ???c ?i?u tr?.  Tri?u ch?ng c?a qu v? tr?m tr?ng h?n.  Qu v? c cc tri?u ch?ng m?i.  Qu v? b? cc m?n n??c ? trong ho?c xung quanh m ??o.  Qu v? c mu ch?y ? m ??o v ? khng ph?i l k? kinh nguy?t c?a qu v?.  Qu v? b? ?au b?ng. Tm t?t  Nhi?m n?m m ??o l m?t tnh tr?ng b?nh l gy ra kh h? c?ng nh? ?au nh?c, s?ng t?y v ?? (vim) ? m ??o.  Tnh tr?ng ny ???c ?i?u tr? b?ng thu?c. Thu?c c th? l lo?i khng k ??n ho?c k ??n.  Ch? dng ho?c bi thu?c khng k ??n v thu?c k ??n theo ch? d?n c?a chuyn gia ch?m [place] s?c kh?e.  Khng th?t r?a. Khng quan h? tnh d?c ho?c s?? du?ng nu?t g?c cho ??n khi ???c chuyn gia ch?m so?c s??c kho?e ch?p thu?n.  Lin h? v?i chuyn gia ch?m [place] s?c kh?e n?u cc tri?u ch?ng c?a qu v? khng ?? khi ?i?u tr? ho?c cc tri?u ch?ng c?a qu v? h?t v sau ? ti pht. Thng tin ny khng nh?m m?c ?ch thay  th? cho l?i khuyn m chuyn gia ch?m [place] s?c kh?e ni v?i qu v?. Hy b?o ??m qu v? ph?i th?o lu?n b?t k? v?n ?? g m qu v? c v?i chuyn gia ch?m  s?c kh?e c?a qu v?. Document Released: 02/17/2005 Document Revised: 09/04/2017 Document Reviewed: 09/04/2017 Elsevier Interactive Patient Education  2019 [employer].

## 2018-04-16 NOTE — MAU Note (Addendum)
Pt having RUQ pain 5/10. It comes and goes. Having some back pain.  Gets worse at night. Having a headache, no visual changes. Headache started a couple of days ago. She states she hasn't slept well the last few nights and feels dizzy and getting a headache from lack of sleep. +FM, no LOF or bleeding.

## 2018-04-16 NOTE — MAU Provider Note (Signed)
History     CSN: [id number]  Arrival date and time: 04/16/18 1111   None     Chief Complaint  Patient presents with  . Abdominal Pain   Jacqueline Gates is a 28 y.o. G2P1001 at [redacted]w[redacted]d who presents for Abdominal Pain. Patient reports she has been having pain for a few days that was intermittent every 4-5x/day.  She states that last night the pain became constant of a dull achy nature that gets worse at times.  She endorses fetal movement and denies LOF and VB.  She also endorses vaginal discharge, but states this has been normal throughout her pregnancy. She reports drinking at least 2 bottles of water daily. No recent sexual activity.      OB History    Gravida  2   Para  1   Term  1   Preterm      AB      Living  1     SAB      TAB      Ectopic      Multiple  0   Live Births  1           Past Medical History:  Diagnosis Date  . Medical history non-contributory     Past Surgical History:  Procedure Laterality Date  . NO PAST SURGERIES      No family history on file.  Social History   Tobacco Use  . Smoking status: Never Smoker  . Smokeless tobacco: Never Used  Substance Use Topics  . Alcohol use: No  . Drug use: No    Allergies: No Known Allergies  Medications Prior to Admission  Medication Sig Dispense Refill Last Dose  . Prenatal MV-Min-FA-Omega-3 (PRENATAL GUMMIES/DHA & FA) 0.4-32.5 MG CHEW Chew 1 each by mouth daily. 30 tablet 4 Past Week at Unknown time  . diphenhydrAMINE (BENADRYL) 25 MG tablet Take 1-2 tablets (25-50 mg total) by mouth every 6 (six) hours as needed for itching. (Patient not taking: Reported on 04/16/2018) 30 tablet 1 Not Taking at Unknown time  . promethazine-dextromethorphan (PROMETHAZINE-DM) 6.25-15 MG/5ML syrup Take 5 mLs by mouth 4 (four) times daily as needed for cough. (Patient not taking: Reported on 04/05/2018) 118 mL 0 Not Taking    Review of Systems  Constitutional: Negative for chills and fever.   Gastrointestinal: Positive for abdominal pain. Negative for diarrhea, nausea and vomiting.  Genitourinary: Negative for dyspareunia, dysuria, vaginal bleeding and vaginal discharge.  Neurological: Negative for dizziness, light-headedness and headaches.   Physical Exam   Blood pressure 94/61, pulse 71, temperature 97.6 F (36.4 C), temperature source Oral, resp. rate 16, weight 57.2 kg, last menstrual period 08/30/2017, SpO2 100 %, not currently breastfeeding.  Physical Exam  Constitutional: She is oriented to person, place, and time. She appears well-developed and well-nourished. No distress.  HENT:  Head: Normocephalic and atraumatic.  Eyes: Conjunctivae are normal.  Neck: Normal range of motion.  Cardiovascular: Normal rate and regular rhythm.  Respiratory: Effort normal and breath sounds normal.  GI: Soft. Bowel sounds are normal.  Genitourinary:    Vagina normal.   Musculoskeletal: Normal range of motion.  Neurological: She is alert and oriented to person, place, and time.  Skin: Skin is warm and dry.  Psychiatric: She has a normal mood and affect. Her behavior is normal.    Fetal Assessment 135 bpm, Mod Var, -Decels, +Accels Toco: Q59min, palpates moderate  MAU Course  No results found for this or any previous  visit (from the past 24 hour(s)).  MDM PE Labs: UA, Wet Prep   Assessment and Plan  28 y.o. Female G2P1001 at 36.3 weeks Cat I FT Contractions Vaginal Discharge  -Exam findings discussed -NST Reactive -Informed of need for treatment for apparent yeast infection. -Discussed proper usage of medication as patient states she was treated in the past and used the medication only when she had itching.  -Wet prep pending -Will continue to monitor  Follow Up (1:23 PM)  -Wet prep returns significant for yeast -Contractions continue Q 3 minutes. Will start procardia protocol and reassess.  Follow Up (3:15 PM)  -S/p 2 doses of procardia. -Patient states her  pain has improved. -WP results discussed with patient. -Rx for Terazol 7 0.4% sent to pharmacy on file.  -Labor Precautions given Keep appt as scheduled: Feb 19th, 2020 -Encouraged to call or return to MAU if symptoms worsen or with the onset of new symptoms. -Discharged to home in improved condition  Cherre Robins MSN, CNM 04/16/2018, 12:03 PM

## 2018-04-21 ENCOUNTER — Encounter: Payer: Self-pay | Admitting: Medical

## 2018-04-21 ENCOUNTER — Ambulatory Visit (INDEPENDENT_AMBULATORY_CARE_PROVIDER_SITE_OTHER): Payer: BLUE CROSS/BLUE SHIELD | Admitting: Medical

## 2018-04-21 ENCOUNTER — Other Ambulatory Visit: Payer: Self-pay

## 2018-04-21 VITALS — BP 95/69 | HR 75 | Wt 125.1 lb

## 2018-04-21 DIAGNOSIS — Z3480 Encounter for supervision of other normal pregnancy, unspecified trimester: Secondary | ICD-10-CM

## 2018-04-21 DIAGNOSIS — Z3A37 37 weeks gestation of pregnancy: Secondary | ICD-10-CM

## 2018-04-21 DIAGNOSIS — Z113 Encounter for screening for infections with a predominantly sexual mode of transmission: Secondary | ICD-10-CM | POA: Diagnosis not present

## 2018-04-21 DIAGNOSIS — Z3483 Encounter for supervision of other normal pregnancy, third trimester: Secondary | ICD-10-CM

## 2018-04-21 NOTE — Progress Notes (Signed)
   PRENATAL VISIT NOTE  Subjective:  Jacqueline Gates is a 28 y.o. G2P1001 at [redacted]w[redacted]d being seen today for ongoing prenatal care.  She is currently monitored for the following issues for this low-risk pregnancy and has Language barrier and Supervision of other normal pregnancy, antepartum on their problem list.  Patient reports occasional contractions and vaginal discharge, being treatment for yeast currently.  Contractions: Irritability. Vag. Bleeding: None.  Movement: Present. Denies leaking of fluid.   The following portions of the patient's history were reviewed and updated as appropriate: allergies, current medications, past family history, past medical history, past social history, past surgical history and problem list. Problem list updated.  Objective:   Vitals:   04/21/18 1033  BP: 95/69  Pulse: 75  Weight: 125 lb 1.6 oz (56.7 kg)    Fetal Status: Fetal Heart Rate (bpm): 157 Fundal Height: 35 cm Movement: Present  Presentation: Vertex  General:  Alert, oriented and cooperative. Patient is in no acute distress.  Skin: Skin is warm and dry. No rash noted.   Cardiovascular: Normal heart rate noted  Respiratory: Normal respiratory effort, no problems with respiration noted  Abdomen: Soft, gravid, appropriate for gestational age.  Pain/Pressure: Present     Pelvic: Cervical exam performed Dilation: 1.5 Effacement (%): 50 Station: -2  Extremities: Normal range of motion.  Edema: None  Mental Status: Normal mood and affect. Normal behavior. Normal judgment and thought content.   Assessment and Plan:  Pregnancy: G2P1001 at [redacted]w[redacted]d  1. Supervision of other normal pregnancy, antepartum - Culture, beta strep (group b only) - Cervicovaginal ancillary only( [place])  Term labor symptoms and general obstetric precautions including but not limited to vaginal bleeding, contractions, leaking of fluid and fetal movement were reviewed in detail with the patient. Please refer to After  Visit Summary for other counseling recommendations.  Return in about 1 week (around 04/28/2018) for LOB.  Vonzella Nipple, PA-C

## 2018-04-21 NOTE — Patient Instructions (Addendum)

## 2018-04-23 LAB — CERVICOVAGINAL ANCILLARY ONLY
Chlamydia: NEGATIVE
Neisseria Gonorrhea: NEGATIVE

## 2018-04-24 LAB — CULTURE, BETA STREP (GROUP B ONLY): STREP GP B CULTURE: POSITIVE — AB

## 2018-04-28 ENCOUNTER — Inpatient Hospital Stay (HOSPITAL_COMMUNITY): Payer: BLUE CROSS/BLUE SHIELD | Admitting: Anesthesiology

## 2018-04-28 ENCOUNTER — Other Ambulatory Visit: Payer: Self-pay

## 2018-04-28 ENCOUNTER — Encounter (HOSPITAL_COMMUNITY): Payer: Self-pay | Admitting: *Deleted

## 2018-04-28 ENCOUNTER — Inpatient Hospital Stay (HOSPITAL_COMMUNITY)
Admission: AD | Admit: 2018-04-28 | Discharge: 2018-04-30 | DRG: 807 | Disposition: A | Discharge status: Home / Self Care | Payer: BLUE CROSS/BLUE SHIELD | Attending: Obstetrics and Gynecology | Admitting: Obstetrics and Gynecology

## 2018-04-28 DIAGNOSIS — Z789 Other specified health status: Secondary | ICD-10-CM | POA: Diagnosis present

## 2018-04-28 DIAGNOSIS — O99824 Streptococcus B carrier state complicating childbirth: Principal | ICD-10-CM | POA: Diagnosis present

## 2018-04-28 DIAGNOSIS — Z3A38 38 weeks gestation of pregnancy: Secondary | ICD-10-CM

## 2018-04-28 DIAGNOSIS — O26893 Other specified pregnancy related conditions, third trimester: Secondary | ICD-10-CM | POA: Diagnosis present

## 2018-04-28 LAB — CBC
HCT: 38.4 % (ref 36.0–46.0)
Hemoglobin: 12.1 g/dL (ref 12.0–15.0)
MCH: 25.9 pg — ABNORMAL LOW (ref 26.0–34.0)
MCHC: 31.5 g/dL (ref 30.0–36.0)
MCV: 82.1 fL (ref 80.0–100.0)
Platelets: 240 10*3/uL (ref 150–400)
RBC: 4.68 MIL/uL (ref 3.87–5.11)
RDW: 14.2 % (ref 11.5–15.5)
WBC: 9.7 10*3/uL (ref 4.0–10.5)
nRBC: 0 % (ref 0.0–0.2)

## 2018-04-28 LAB — TYPE AND SCREEN
ABO/RH(D): O POS
Antibody Screen: NEGATIVE

## 2018-04-28 LAB — ABO/RH: ABO/RH(D): O POS

## 2018-04-28 MED ORDER — FENTANYL CITRATE (PF) 100 MCG/2ML IJ SOLN
100.0000 ug | INTRAMUSCULAR | Status: DC | PRN
  Administered 2018-04-28: 100 ug via INTRAVENOUS
  Filled 2018-04-28: qty 2

## 2018-04-28 MED ORDER — EPHEDRINE 5 MG/ML INJ: 10.0000 mg | INTRAVENOUS | Status: DC | PRN

## 2018-04-28 MED ORDER — SODIUM CHLORIDE (PF) 0.9 % IJ SOLN
INTRAMUSCULAR | Status: DC | PRN
  Administered 2018-04-28: 12 mL/h via EPIDURAL

## 2018-04-28 MED ORDER — FENTANYL-BUPIVACAINE-NACL 0.5-0.125-0.9 MG/250ML-% EP SOLN
12.0000 mL/h | EPIDURAL | Status: DC | PRN
  Filled 2018-04-28: qty 250

## 2018-04-28 MED ORDER — OXYTOCIN BOLUS FROM INFUSION
500.0000 mL | Freq: Once | INTRAVENOUS | Status: AC
  Administered 2018-04-28: 500 mL via INTRAVENOUS

## 2018-04-28 MED ORDER — LACTATED RINGERS IV SOLN: 500.0000 mL | INTRAVENOUS | Status: DC | PRN

## 2018-04-28 MED ORDER — IBUPROFEN 600 MG PO TABS
600.0000 mg | ORAL_TABLET | Freq: Four times a day (QID) | ORAL | Status: DC
  Administered 2018-04-28 – 2018-04-29 (×3): 600 mg via ORAL
  Filled 2018-04-28 (×6): qty 1

## 2018-04-28 MED ORDER — DIBUCAINE 1 % RE OINT: 1.0000 "application " | TOPICAL_OINTMENT | RECTAL | Status: DC | PRN

## 2018-04-28 MED ORDER — ACETAMINOPHEN 325 MG PO TABS
650.0000 mg | ORAL_TABLET | ORAL | Status: DC | PRN
  Administered 2018-04-29 (×2): 650 mg via ORAL
  Filled 2018-04-28 (×2): qty 2

## 2018-04-28 MED ORDER — LACTATED RINGERS IV SOLN: 500.0000 mL | Freq: Once | INTRAVENOUS | Status: DC

## 2018-04-28 MED ORDER — ONDANSETRON HCL 4 MG/2ML IJ SOLN: 4.0000 mg | INTRAMUSCULAR | Status: DC | PRN

## 2018-04-28 MED ORDER — DIPHENHYDRAMINE HCL 50 MG/ML IJ SOLN: 12.5000 mg | INTRAMUSCULAR | Status: DC | PRN

## 2018-04-28 MED ORDER — ONDANSETRON HCL 4 MG PO TABS: 4.0000 mg | ORAL_TABLET | ORAL | Status: DC | PRN

## 2018-04-28 MED ORDER — BENZOCAINE-MENTHOL 20-0.5 % EX AERO
1.0000 "application " | INHALATION_SPRAY | CUTANEOUS | Status: DC | PRN
  Administered 2018-04-29 – 2018-04-30 (×2): 1 via TOPICAL
  Filled 2018-04-28 (×2): qty 56

## 2018-04-28 MED ORDER — LIDOCAINE HCL (PF) 1 % IJ SOLN
INTRAMUSCULAR | Status: DC | PRN
  Administered 2018-04-28: 5 mL via EPIDURAL

## 2018-04-28 MED ORDER — PHENYLEPHRINE 40 MCG/ML (10ML) SYRINGE FOR IV PUSH (FOR BLOOD PRESSURE SUPPORT): 80.0000 ug | PREFILLED_SYRINGE | INTRAVENOUS | Status: DC | PRN

## 2018-04-28 MED ORDER — WITCH HAZEL-GLYCERIN EX PADS: 1.0000 "application " | MEDICATED_PAD | CUTANEOUS | Status: DC | PRN

## 2018-04-28 MED ORDER — MEASLES, MUMPS & RUBELLA VAC IJ SOLR: 0.5000 mL | Freq: Once | INTRAMUSCULAR | Status: DC

## 2018-04-28 MED ORDER — ONDANSETRON HCL 4 MG/2ML IJ SOLN: 4.0000 mg | Freq: Four times a day (QID) | INTRAMUSCULAR | Status: DC | PRN

## 2018-04-28 MED ORDER — DIPHENHYDRAMINE HCL 25 MG PO CAPS: 25.0000 mg | ORAL_CAPSULE | Freq: Four times a day (QID) | ORAL | Status: DC | PRN

## 2018-04-28 MED ORDER — PENICILLIN G 3 MILLION UNITS IVPB - SIMPLE MED
3.0000 10*6.[IU] | INTRAVENOUS | Status: DC
  Administered 2018-04-28: 3 10*6.[IU] via INTRAVENOUS
  Filled 2018-04-28: qty 100

## 2018-04-28 MED ORDER — ACETAMINOPHEN 325 MG PO TABS: 650.0000 mg | ORAL_TABLET | ORAL | Status: DC | PRN

## 2018-04-28 MED ORDER — SODIUM CHLORIDE 0.9 % IV SOLN
5.0000 10*6.[IU] | Freq: Once | INTRAVENOUS | Status: AC
  Administered 2018-04-28: 5 10*6.[IU] via INTRAVENOUS
  Filled 2018-04-28: qty 5

## 2018-04-28 MED ORDER — FENTANYL-BUPIVACAINE-NACL 0.5-0.125-0.9 MG/250ML-% EP SOLN: 12.0000 mL/h | EPIDURAL | Status: DC | PRN

## 2018-04-28 MED ORDER — OXYCODONE-ACETAMINOPHEN 5-325 MG PO TABS: 2.0000 | ORAL_TABLET | ORAL | Status: DC | PRN

## 2018-04-28 MED ORDER — LACTATED RINGERS IV SOLN
INTRAVENOUS | Status: DC
  Administered 2018-04-28 (×2): via INTRAVENOUS

## 2018-04-28 MED ORDER — OXYCODONE-ACETAMINOPHEN 5-325 MG PO TABS: 1.0000 | ORAL_TABLET | ORAL | Status: DC | PRN

## 2018-04-28 MED ORDER — SOD CITRATE-CITRIC ACID 500-334 MG/5ML PO SOLN: 30.0000 mL | ORAL | Status: DC | PRN

## 2018-04-28 MED ORDER — LIDOCAINE HCL (PF) 1 % IJ SOLN: 30.0000 mL | INTRAMUSCULAR | Status: DC | PRN

## 2018-04-28 MED ORDER — COCONUT OIL OIL: 1.0000 "application " | TOPICAL_OIL | Status: DC | PRN

## 2018-04-28 MED ORDER — SENNOSIDES-DOCUSATE SODIUM 8.6-50 MG PO TABS
2.0000 | ORAL_TABLET | ORAL | Status: DC
  Administered 2018-04-29 (×2): 2 via ORAL
  Filled 2018-04-28 (×2): qty 2

## 2018-04-28 MED ORDER — PRENATAL MULTIVITAMIN CH: 1.0000 | ORAL_TABLET | Freq: Every day | ORAL | Status: DC

## 2018-04-28 MED ORDER — SIMETHICONE 80 MG PO CHEW: 80.0000 mg | CHEWABLE_TABLET | ORAL | Status: DC | PRN

## 2018-04-28 MED ORDER — LACTATED RINGERS IV SOLN
500.0000 mL | Freq: Once | INTRAVENOUS | Status: AC
  Administered 2018-04-28: 500 mL via INTRAVENOUS

## 2018-04-28 MED ORDER — ZOLPIDEM TARTRATE 5 MG PO TABS: 5.0000 mg | ORAL_TABLET | Freq: Every evening | ORAL | Status: DC | PRN

## 2018-04-28 MED ORDER — TETANUS-DIPHTH-ACELL PERTUSSIS 5-2.5-18.5 LF-MCG/0.5 IM SUSP: 0.5000 mL | Freq: Once | INTRAMUSCULAR | Status: DC

## 2018-04-28 MED ORDER — OXYTOCIN 40 UNITS IN NORMAL SALINE INFUSION - SIMPLE MED
2.5000 [IU]/h | INTRAVENOUS | Status: DC
  Administered 2018-04-28: 2.5 [IU]/h via INTRAVENOUS
  Filled 2018-04-28: qty 1000

## 2018-04-28 NOTE — Progress Notes (Signed)
Pt requesting iv pain med while waiting for labs to result

## 2018-04-28 NOTE — Anesthesia Procedure Notes (Signed)
Epidural Patient location during procedure: OB Start time: 04/28/2018 12:46 PM End time: 04/28/2018 1:04 PM  Staffing Anesthesiologist: Trevor Iha, MD Performed: anesthesiologist   Preanesthetic Checklist Completed: patient identified, site marked, surgical consent, pre-op evaluation, timeout performed, IV checked, risks and benefits discussed and monitors and equipment checked  Epidural Patient position: sitting Prep: site prepped and draped and DuraPrep Patient monitoring: continuous pulse ox and blood pressure Approach: midline Injection technique: LOR air  Needle:  Needle type: Tuohy  Needle gauge: 17 G Needle length: 9 cm and 9 Needle insertion depth: 4 cm Catheter type: closed end flexible Catheter size: 19 Gauge Catheter at skin depth: 10 cm Test dose: negative  Assessment Events: blood not aspirated, injection not painful, no injection resistance, negative IV test and no paresthesia  Additional Notes Patient identified. Risks/Benefits/Options discussed with patient including but not limited to bleeding, infection, nerve damage, paralysis, failed block, incomplete pain control, headache, blood pressure changes, nausea, vomiting, reactions to medication both or allergic, itching and postpartum back pain. Confirmed with bedside nurse the patient's most recent platelet count. Confirmed with patient that they are not currently taking any anticoagulation, have any bleeding history or any family history of bleeding disorders. Patient expressed understanding and wished to proceed. All questions were answered. Sterile technique was used throughout the entire procedure. Please see nursing notes for vital signs. Test dose was given through epidural needle and negative prior to continuing to dose epidural or start infusion. Warning signs of high block given to the patient including shortness of breath, tingling/numbness in hands, complete motor block, or any concerning symptoms with  instructions to call for help. Patient was given instructions on fall risk and not to get out of bed. All questions and concerns addressed with instructions to call with any issues. 1 Attempt (S) . Patient tolerated procedure well.

## 2018-04-28 NOTE — Anesthesia Preprocedure Evaluation (Signed)
Anesthesia Evaluation   Patient awake    Reviewed: Allergy & Precautions, NPO status , Patient's Chart, lab work & pertinent test results  Airway Mallampati: II  TM Distance: >3 FB Neck ROM: Full    Dental no notable dental hx. (+) Teeth Intact   Pulmonary neg pulmonary ROS,    Pulmonary exam normal breath sounds clear to auscultation       Cardiovascular negative cardio ROS Normal cardiovascular exam Rhythm:Regular Rate:Normal     Neuro/Psych negative neurological ROS  negative psych ROS   GI/Hepatic negative GI ROS,   Endo/Other    Renal/GU      Musculoskeletal   Abdominal   Peds  Hematology Hgb 12.1 Plts 240   Anesthesia Other Findings Pt Vietnamese speaking Hx w Translator  Reproductive/Obstetrics (+) Pregnancy Group B Strep Pos                             Anesthesia Physical Anesthesia Plan  ASA: II  Anesthesia Plan: Epidural   Post-op Pain Management:    Induction:   PONV Risk Score and Plan:   Airway Management Planned:   Additional Equipment:   Intra-op Plan:   Post-operative Plan:   Informed Consent: I have reviewed the patients History and Physical, chart, labs and discussed the procedure including the risks, benefits and alternatives for the proposed anesthesia with the patient or authorized representative who has indicated his/her understanding and acceptance.       Plan Discussed with:   Anesthesia Plan Comments: (Veitnamese speaking , Patient Group B strp Pos, For LEA)        Anesthesia Quick Evaluation

## 2018-04-28 NOTE — H&P (Addendum)
OBSTETRIC ADMISSION HISTORY AND PHYSICAL  Jacqueline Gates is a 28 y.o. female G2P1001 with IUP at [redacted]w[redacted]d by LMP presenting for spontaneous onset labor. She reports +FMs, No LOF, no VB, no blurry vision, headaches or peripheral edema, and RUQ pain.  She plans on bottle feeding. Birth control discussed with patient.  She is considering her options.   She received her prenatal care at [facility].  She had a previous vaginal delivery, and denies complications including excess blood loss.    Dating: By LMP --->  Estimated Date of Delivery: 05/11/18  Sono:    @[redacted]w[redacted]d , CWD, normal anatomy, breech presentation, 984 g, 52% EFW   Prenatal History/Complications: -Language barrier affecting healthcare  Past Medical History: Past Medical History:  Diagnosis Date  . Medical history non-contributory     Past Surgical History: Past Surgical History:  Procedure Laterality Date  . NO PAST SURGERIES      Obstetrical History: OB History    Gravida  2   Para  1   Term  1   Preterm      AB      Living  1     SAB      TAB      Ectopic      Multiple  0   Live Births  1           Social History: Social History   Socioeconomic History  . Marital status: Married    Spouse name: Not on file  . Number of children: Not on file  . Years of education: Not on file  . Highest education level: Not on file  Occupational History  . Not on file  Social Needs  . Financial resource strain: Not on file  . Food insecurity:    Worry: Not on file    Inability: Not on file  . Transportation needs:    Medical: Not on file    Non-medical: Not on file  Tobacco Use  . Smoking status: Never Smoker  . Smokeless tobacco: Never Used  Substance and Sexual Activity  . Alcohol use: No  . Drug use: No  . Sexual activity: Not Currently    Birth control/protection: None  Lifestyle  . Physical activity:    Days per week: Not on file    Minutes per session: Not on file  . Stress: Not on file   Relationships  . Social connections:    Talks on phone: Not on file    Gets together: Not on file    Attends religious service: Not on file    Active member of club or organization: Not on file    Attends meetings of clubs or organizations: Not on file    Relationship status: Not on file  Other Topics Concern  . Not on file  Social History Narrative  . Not on file    Family History: History reviewed. No pertinent family history.  Allergies: No Known Allergies  Medications Prior to Admission  Medication Sig Dispense Refill Last Dose  . diphenhydrAMINE (BENADRYL) 25 MG tablet Take 1-2 tablets (25-50 mg total) by mouth every 6 (six) hours as needed for itching. (Patient not taking: Reported on 04/16/2018) 30 tablet 1 Not Taking  . Prenatal MV-Min-FA-Omega-3 (PRENATAL GUMMIES/DHA & FA) 0.4-32.5 MG CHEW Chew 1 each by mouth daily. 30 tablet 4 Taking  . terconazole (TERAZOL 7) 0.4 % vaginal cream Place 1 applicator vaginally at bedtime. 45 g 0 Taking     Review of  Systems   All systems reviewed and negative except as stated in HPI  Blood pressure 111/69, pulse 81, temperature 98.1 F (36.7 C), temperature source Oral, resp. rate 20, height 5\' 3"  (1.6 m), weight 57.2 kg, last menstrual period 08/30/2017, not currently breastfeeding. General appearance: alert, cooperative and moderate distress Lungs: clear to auscultation bilaterally Heart: regular rate and rhythm Abdomen: soft, non-tender; bowel sounds normal Extremities: Homans sign is negative, no sign of DVT Presentation: vertex Fetal monitoringBaseline: 140 bpm, Variability: Good {> 6 bpm), Accelerations: Reactive and Decelerations: Absent Uterine activityDate/time of onset: 04/28/2018 at 3:00AM Dilation: 4 Effacement (%): 80 Station: -2 Exam by:: Georgiana Shore RN   Prenatal labs: ABO, Rh: O/Positive/-- (09/03 8182) Antibody: Negative (09/03 0938) Rubella: 3.32 (09/03 0938) RPR: Non Reactive (01/03 0000)  HBsAg: Negative  (09/03 9937)  HIV: Non Reactive (01/03 0000)  GBS:   positive (04/21/2018) 1 hr Glucola 111 (04/05/2018) Genetic screening:  CF negative, SMA shows 2 gene copies, Hgb E trait Anatomy US Normal  Prenatal Transfer Tool  Maternal Diabetes: No Genetic Screening: Normal Maternal Ultrasounds/Referrals: Normal Fetal Ultrasounds or other Referrals:  None Maternal Substance Abuse:  No Significant Maternal Medications:  None Significant Maternal Lab Results: None  Results for orders placed or performed during the hospital encounter of 04/28/18 (from the past 24 hour(s))  CBC   Collection Time: 04/28/18 10:59 AM  Result Value Ref Range   WBC 9.7 4.0 - 10.5 K/uL   RBC 4.68 3.87 - 5.11 MIL/uL   Hemoglobin 12.1 12.0 - 15.0 g/dL   HCT 16.9 67.8 - 93.8 %   MCV 82.1 80.0 - 100.0 fL   MCH 25.9 (L) 26.0 - 34.0 pg   MCHC 31.5 30.0 - 36.0 g/dL   RDW 10.1 75.1 - 02.5 %   Platelets 240 150 - 400 K/uL   nRBC 0.0 0.0 - 0.2 %    Patient Active Problem List   Diagnosis Date Noted  . Indication for care in labor or delivery 04/28/2018  . Supervision of other normal pregnancy, antepartum 11/03/2017  . Language barrier 07/21/2016    Assessment/Plan:  Jacqueline Gates is a 28 y.o. G2P1001 at [redacted]w[redacted]d here for SOL  #Labor: progressing well #Pain: Epidural as requested #FWB: Cat I  #ID:  GBS +, initiate PCN G #MOF: Bottle  #MOC: interested in discussing later #Circ:  yes #Language barrier: using vietnamese translator  Francene Boyers, Student-PA  04/28/2018, 12:44 PM   OB FELLOW HISTORY AND PHYSICAL ATTESTATION  I have seen and examined this patient; I agree with above documentation in the resident's note.   Marcy Siren, D.O. OB Fellow  04/28/2018, 5:47 PM

## 2018-04-28 NOTE — Anesthesia Pain Management Evaluation Note (Signed)
  CRNA Pain Management Visit Note  Patient: Jacqueline Gates, 28 y.o., female  "Hello I am a member of the anesthesia team at [facility] and [employer]. We have an anesthesia team available at all times to provide care throughout the hospital, including epidural management and anesthesia for C-section. I don't know your plan for the delivery whether it a natural birth, water birth, IV sedation, nitrous supplementation, doula or epidural, but we want to meet your pain goals."   1.Was your pain managed to your expectations on prior hospitalizations?   Yes   2.What is your expectation for pain management during this hospitalization?     Epidural  3.How can we help you reach that goal? **has an epidural currently and it is working, wants to have pain of 1-3 on delivery vaginally *  Record the patient's initial score and the patient's pain goal.   Pain: 2  Pain Goal: 3 [facility] wants you to be able to say your pain was always managed very well.  Mauricia Area 04/28/2018

## 2018-04-28 NOTE — MAU Note (Signed)
Pt C/O contractions since 0300, has had small amount of bleeding this a.m., denies LOF.  Reports good fetal movement.

## 2018-04-29 LAB — RPR: RPR Ser Ql: NONREACTIVE

## 2018-04-29 NOTE — Anesthesia Postprocedure Evaluation (Signed)
Anesthesia Post Note  Patient: Jacqueline Gates  Procedure(s) Performed: AN AD HOC LABOR EPIDURAL     Patient location during evaluation: Mother Baby Anesthesia Type: Epidural Level of consciousness: awake Pain management: satisfactory to patient Vital Signs Assessment: post-procedure vital signs reviewed and stable Respiratory status: spontaneous breathing Cardiovascular status: stable Anesthetic complications: no    Last Vitals:  Vitals:   04/29/18 0238 04/29/18 0534  BP: 99/69 104/67  Pulse: 83 71  Resp: 16 16  Temp: 37 C 36.7 C  SpO2: 98% 98%    Last Pain:  Vitals:   04/29/18 0534  TempSrc: Oral  PainSc: 3    Pain Goal: Patients Stated Pain Goal: 0 (04/28/18 0945)                 Cephus Shelling

## 2018-04-29 NOTE — Progress Notes (Addendum)
POSTPARTUM PROGRESS NOTE  Post Partum Day 1 Subjective:  Jacqueline Gates is a 28 y.o. G2P1001 [redacted]w[redacted]d s/p SVD.  No acute events overnight.  Pt denies problems with ambulating, voiding or po intake.  She denies nausea or vomiting.  Pain is well controlled.  She has had flatus. She has not had bowel movement.  Lochia Small.   Objective: Blood pressure 104/67, pulse 71, temperature 98.1 F (36.7 C), temperature source Oral, resp. rate 16, height 5\' 3"  (1.6 m), weight 57.2 kg, last menstrual period 08/30/2017, SpO2 98 %, not currently breastfeeding.  Physical Exam:  General: alert, cooperative and no distress Lochia:normal flow Chest: no respiratory distress Heart:regular rate, distal pulses intact Abdomen: soft, nontender  Uterine Fundus: firm, appropriately tender, below the umbilicus DVT Evaluation: No calf swelling or tenderness Extremities: no edema  Recent Labs    04/28/18 1059  HGB 12.1  HCT 38.4    Assessment/Plan:  ASSESSMENT: Jacqueline Gates is a 28 y.o. G2P1001 [redacted]w[redacted]d s/p SVD.  Plan for discharge tomorrow   LOS: 1 day   Arlyce Harman, DO [facility], PGY-2 04/29/2018, 8:02 AM    Midwife attestation Post Partum Day 1 I have seen and examined this patient and agree with above documentation in the resident's note.   Jacqueline Gates is a 28 y.o. G2P1001 s/p NSVD.  Pt denies problems with ambulating, voiding or po intake. Pain is well controlled.  Plan for birth control is undecided.  Method of Feeding: breast and bottle  PE:  BP (!) 89/69 (BP Location: Right Arm) Comment: Nurse notified  Pulse 85   Temp 97.7 F (36.5 C) (Oral)   Resp 16   Ht 5\' 3"  (1.6 m)   Wt 57.2 kg   LMP 08/30/2017   SpO2 100%   BMI 22.32 kg/m  Gen: well appearing Heart: reg rate Lungs: normal WOB Fundus firm Ext: soft, no pain, no edema  Plan for discharge: tomorrow  Sharen Counter, CNM 8:08 PM

## 2018-04-29 NOTE — Progress Notes (Signed)
ID# [id number] used for interpretation

## 2018-04-30 ENCOUNTER — Encounter (HOSPITAL_COMMUNITY): Payer: Self-pay | Admitting: *Deleted

## 2018-04-30 MED ORDER — IBUPROFEN 800 MG PO TABS: 800.0000 mg | ORAL_TABLET | Freq: Three times a day (TID) | ORAL | 0 refills | Status: DC

## 2018-04-30 MED ORDER — SENNOSIDES-DOCUSATE SODIUM 8.6-50 MG PO TABS: 2.0000 | ORAL_TABLET | Freq: Every evening | ORAL | 0 refills | Status: AC | PRN

## 2018-04-30 NOTE — Discharge Instructions (Signed)
Vaginal Delivery, Care After °Refer to this sheet in the next few weeks. These instructions provide you with information about caring for yourself after vaginal delivery. Your health care provider may also give you more specific instructions. Your treatment has been planned according to current medical practices, but problems sometimes occur. Call your health care provider if you have any problems or questions. °What can I expect after the procedure? °After vaginal delivery, it is common to have: °· Some bleeding from your vagina. °· Soreness in your abdomen, your vagina, and the area of skin between your vaginal opening and your anus (perineum). °· Pelvic cramps. °· Fatigue. °Follow these instructions at home: °Medicines °· Take over-the-counter and prescription medicines only as told by your health care provider. °· If you were prescribed an antibiotic medicine, take it as told by your health care provider. Do not stop taking the antibiotic until it is finished. °Driving ° °· Do not drive or operate heavy machinery while taking prescription pain medicine. °· Do not drive for 24 hours if you received a sedative. °Lifestyle °· Do not drink alcohol. This is especially important if you are breastfeeding or taking medicine to relieve pain. °· Do not use tobacco products, including cigarettes, chewing tobacco, or e-cigarettes. If you need help quitting, ask your health care provider. °Eating and drinking °· Drink at least 8 eight-ounce glasses of water every day unless you are told not to by your health care provider. If you choose to breastfeed your baby, you may need to drink more water than this. °· Eat high-fiber foods every day. These foods may help prevent or relieve constipation. High-fiber foods include: °? Whole grain cereals and breads. °? Brown rice. °? Beans. °? Fresh fruits and vegetables. °Activity °· Return to your normal activities as told by your health care provider. Ask your health care provider what  activities are safe for you. °· Rest as much as possible. Try to rest or take a nap when your baby is sleeping. °· Do not lift anything that is heavier than your baby or 10 lb (4.5 kg) until your health care provider says that it is safe. °· Talk with your health care provider about when you can engage in sexual activity. This may depend on your: °? Risk of infection. °? Rate of healing. °? Comfort and desire to engage in sexual activity. °Vaginal Care °· If you have an episiotomy or a vaginal tear, check the area every day for signs of infection. Check for: °? More redness, swelling, or pain. °? More fluid or blood. °? Warmth. °? Pus or a bad smell. °· Do not use tampons or douches until your health care provider says this is safe. °· Watch for any blood clots that may pass from your vagina. These may look like clumps of dark red, brown, or black discharge. °General instructions °· Keep your perineum clean and dry as told by your health care provider. °· Wear loose, comfortable clothing. °· Wipe from front to back when you use the toilet. °· Ask your health care provider if you can shower or take a bath. If you had an episiotomy or a perineal tear during labor and delivery, your health care provider may tell you not to take baths for a certain length of time. °· Wear a bra that supports your breasts and fits you well. °· If possible, have someone help you with household activities and help care for your baby for at least a few days after you   leave the hospital. °· Keep all follow-up visits for you and your baby as told by your health care provider. This is important. °Contact a health care provider if: °· You have: °? Vaginal discharge that has a bad smell. °? Difficulty urinating. °? Pain when urinating. °? A sudden increase or decrease in the frequency of your bowel movements. °? More redness, swelling, or pain around your episiotomy or vaginal tear. °? More fluid or blood coming from your episiotomy or vaginal  tear. °? Pus or a bad smell coming from your episiotomy or vaginal tear. °? A fever. °? A rash. °? Little or no interest in activities you used to enjoy. °? Questions about caring for yourself or your baby. °· Your episiotomy or vaginal tear feels warm to the touch. °· Your episiotomy or vaginal tear is separating or does not appear to be healing. °· Your breasts are painful, hard, or turn red. °· You feel unusually sad or worried. °· You feel nauseous or you vomit. °· You pass large blood clots from your vagina. If you pass a blood clot from your vagina, save it to show to your health care provider. Do not flush blood clots down the toilet without having your health care provider look at them. °· You urinate more than usual. °· You are dizzy or light-headed. °· You have not breastfed at all and you have not had a menstrual period for 12 weeks after delivery. °· You have stopped breastfeeding and you have not had a menstrual period for 12 weeks after you stopped breastfeeding. °Get help right away if: °· You have: °? Pain that does not go away or does not get better with medicine. °? Chest pain. °? Difficulty breathing. °? Blurred vision or spots in your vision. °? Thoughts about hurting yourself or your baby. °· You develop pain in your abdomen or in one of your legs. °· You develop a severe headache. °· You faint. °· You bleed from your vagina so much that you fill two sanitary pads in one hour. °This information is not intended to replace advice given to you by your health care provider. Make sure you discuss any questions you have with your health care provider. °Document Released: 02/15/2000 Document Revised: 08/01/2015 Document Reviewed: 03/04/2015 °Elsevier Interactive Patient Education © 2019 [employer] Inc. ° °

## 2018-04-30 NOTE — Discharge Summary (Signed)
Obstetrics Discharge Summary OB/GYN [facility]   Patient Name: Jacqueline Gates DOB: [birth year] MRN: [id number]  Date of admission: 04/28/2018 Delivering MD: Arvilla Market   Date of discharge: 04/30/2018  Admitting diagnosis: 38WKS,BLEEDING,CRAMPING Intrauterine pregnancy: [redacted]w[redacted]d     Secondary diagnosis:   Active Problems:   Language barrier   Indication for care in labor or delivery    Discharge diagnosis: Term Pregnancy Delivered                                            Postpartum procedures: None  Complications: None  Outpatient Follow-Up: [ ]  continue to counsel on method of contraception  Hospital course: Jacqueline Gates is a 28 y.o. [redacted]w[redacted]d who was admitted for spontaneous onset of labor. Her pregnancy was uncomplicated. Her labor course was notable for epidural placement, SROM. Delivery was uncomplicated. Please see delivery/op note for additional details. Her postpartum course was uncomplicated. She was bottle feeding. By day of discharge, she was passing flatus, urinating, eating and drinking without difficulty. Her pain was well-controlled, and she was discharged home with ibuprofen. She will follow-up in clinic in 4-6 weeks.   Physical exam  Vitals:   04/29/18 0534 04/29/18 1610 04/29/18 2231 04/30/18 0500  BP: 104/67 (!) 89/69 97/69 91/73   Pulse: 71 85 84 71  Resp: 16 16 14 16   Temp: 98.1 F (36.7 C) 97.7 F (36.5 C) 98.6 F (37 C) 97.7 F (36.5 C)  TempSrc: Oral Oral Oral Oral  SpO2: 98% 100% 98% 99%  Weight:      Height:       General: well-appearing, NAD Lochia: appropriate Uterine Fundus: firm Incision: N/A DVT Evaluation: No significant calf/ankle edema. Labs: Lab Results  Component Value Date   WBC 9.7 04/28/2018   HGB 12.1 04/28/2018   HCT 38.4 04/28/2018   MCV 82.1 04/28/2018   PLT 240 04/28/2018   CMP Latest Ref Rng & Units 04/05/2018  Glucose 65 - 99 mg/dL -  BUN 6 - 20 mg/dL -  Creatinine 7.62 - 2.63 mg/dL -   Sodium 335 - 456 mmol/L -  Potassium 3.5 - 5.1 mmol/L -  Chloride 101 - 111 mmol/L -  CO2 22 - 32 mmol/L -  Calcium 8.9 - 10.3 mg/dL -  Total Protein 6.0 - 8.5 g/dL 6.5  Total Bilirubin 0.0 - 1.2 mg/dL 0.2  Alkaline Phos 39 - 117 IU/L 193(H)  AST 0 - 40 IU/L 18  ALT 0 - 32 IU/L 9    Discharge instructions: Per After Visit Summary and "Baby and Me Booklet"  After visit meds:  Allergies as of 04/30/2018   No Known Allergies     Medication List    STOP taking these medications   diphenhydrAMINE 25 MG tablet Commonly known as:  BENADRYL   terconazole 0.4 % vaginal cream Commonly known as:  TERAZOL 7     TAKE these medications   ibuprofen 800 MG tablet Commonly known as:  ADVIL,MOTRIN Take 1 tablet (800 mg total) by mouth 3 (three) times daily.   PRENATAL GUMMIES/DHA & FA 0.4-32.5 MG Chew Chew 1 each by mouth daily.   senna-docusate 8.6-50 MG tablet Commonly known as:  Senokot-S Take 2 tablets by mouth at bedtime as needed for mild constipation.       Postpartum contraception: undecided Diet: Routine Diet Activity: Advance as tolerated. Pelvic rest  for 6 weeks.   Follow-up Appt: Future Appointments  Date Time Provider Department Center  05/26/2018 10:55 AM Burleson, Brand Males, NP [facility]   Please schedule this patient for Postpartum visit in: 4 weeks with the following provider: Any provider Low risk pregnancy complicated by: none Delivery mode:  SVD Anticipated Birth Control:  other/unsure PP Procedures needed: none  Schedule Integrated BH visit: no  Newborn Data: Live born female  Birth Weight: 6 lb 11.4 oz (3045 g) APGAR: 9, 9  Newborn Delivery   Birth date/time:  04/28/2018 16:01:00 Delivery type:  Vaginal, Spontaneous    Baby Feeding: Bottle Disposition:home with mother  Cristal Deer. Earlene Plater, DO OB/GYN Fellow, Faculty Practice   Arabella Merles CNM 04/30/2018 10:45 AM

## 2018-05-04 ENCOUNTER — Encounter: Payer: Self-pay | Admitting: Student

## 2018-05-26 ENCOUNTER — Other Ambulatory Visit: Payer: Self-pay

## 2018-05-26 ENCOUNTER — Telehealth: Payer: Self-pay

## 2018-05-26 ENCOUNTER — Ambulatory Visit: Payer: BLUE CROSS/BLUE SHIELD | Admitting: Nurse Practitioner

## 2018-05-26 NOTE — Telephone Encounter (Signed)
Called pt for telehealth Postpartum visit using [employer] id# [phone], no answer left VM.

## 2018-05-26 NOTE — Progress Notes (Signed)
Was not able to be contacted for telephone visit. Will need to be rescheduled for postpartum visit - not done today.  Nolene Bernheim, RN, MSN, NP-BC Nurse Practitioner, [facility] for [employer], [facility] 05/26/2018 1:32 PM

## 2018-09-05 IMAGING — US US OB COMP LESS 14 WK
1 series · 15 of 28 positions shown · non-contrast
Comparison: 03/16/2016

CLINICAL DATA: Vaginal bleeding. Current assigned gestational age
of 10 weeks 0 days by prior ultrasound.

EXAM:
OBSTETRIC <14 WK ULTRASOUND
TECHNIQUE: Transabdominal ultrasound was performed for evaluation of the
gestation as well as the maternal uterus and adnexal regions.

[Series 1: us ob comp less 14 wk · 15 of 37 slices shown]
[im 1/37]
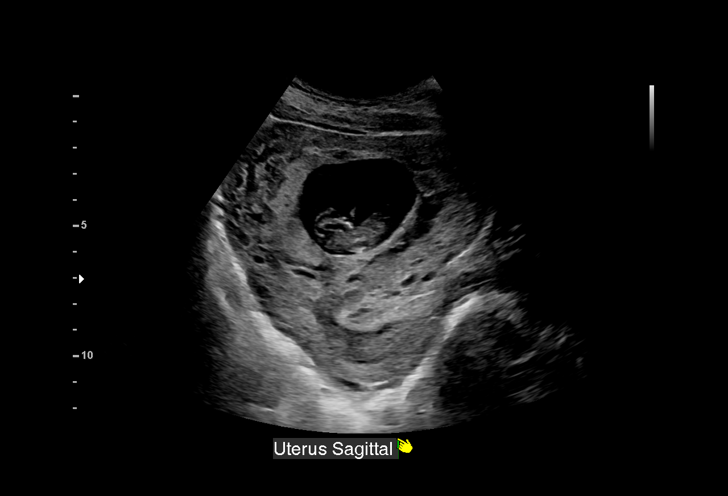
[im 3/37]
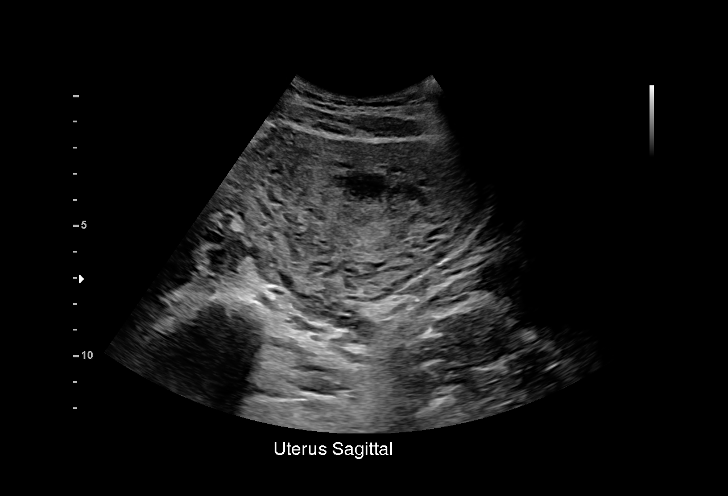
[im 6/37]
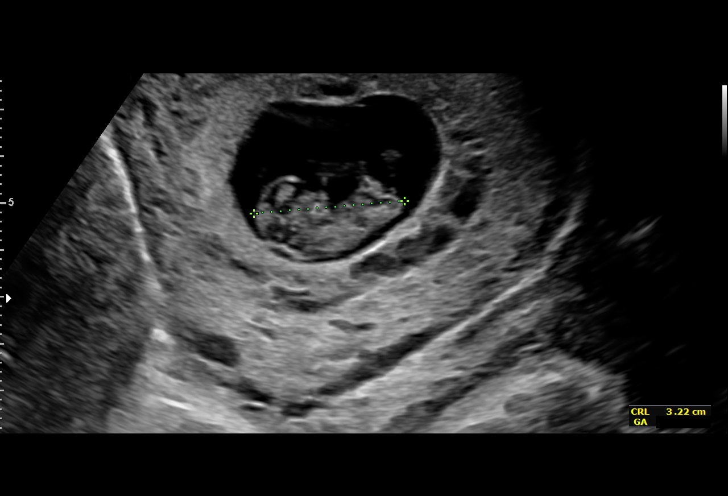
[im 9/37]
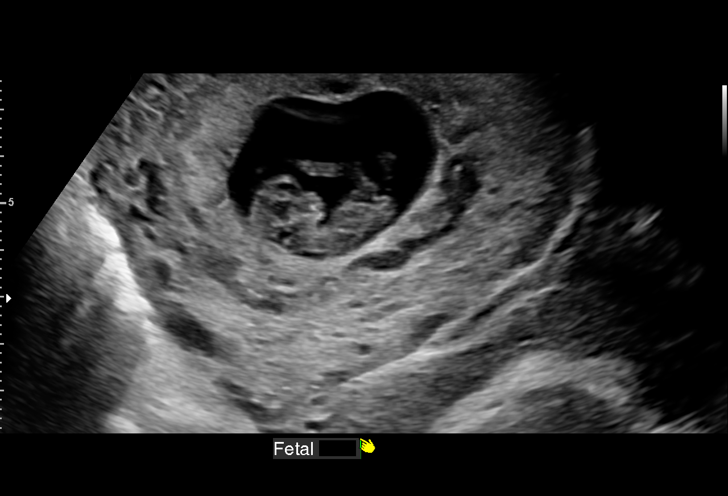
[im 11/37]
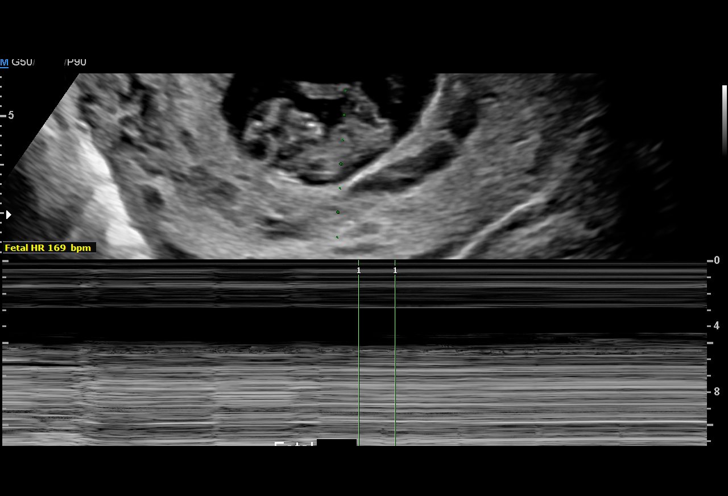
[im 14/37]
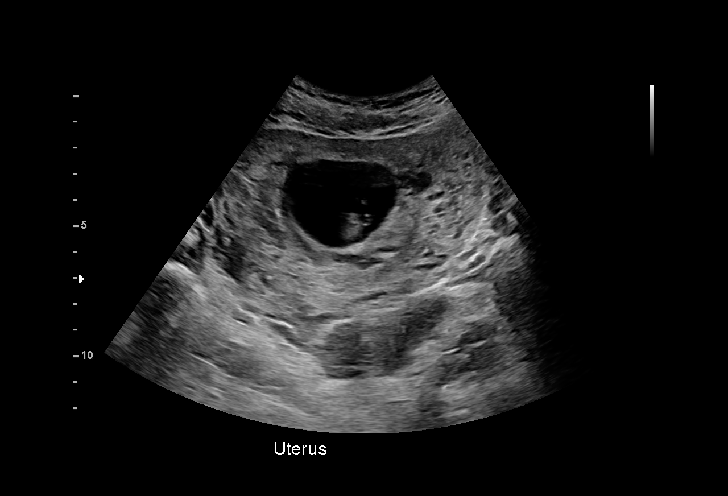
[im 17/37]
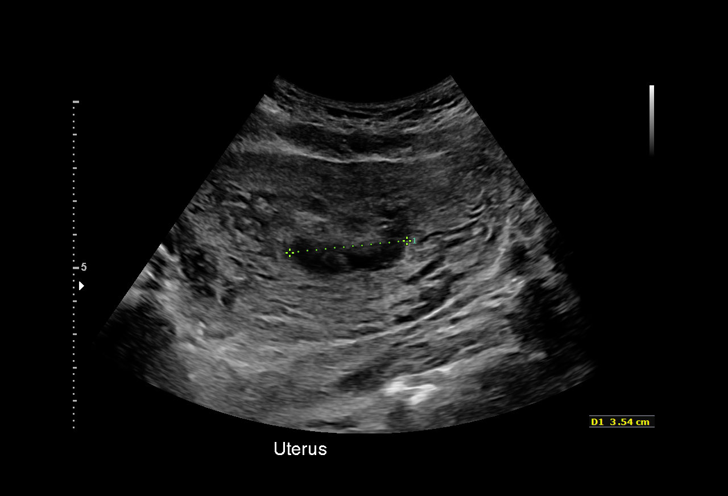
[im 19/37]
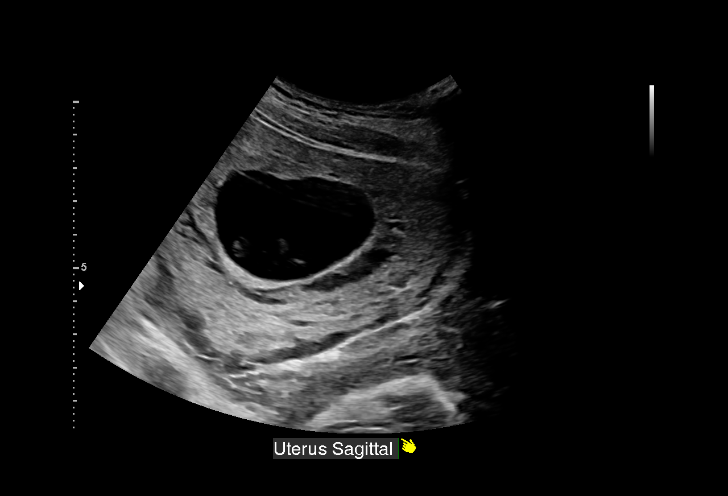
[im 21/37]
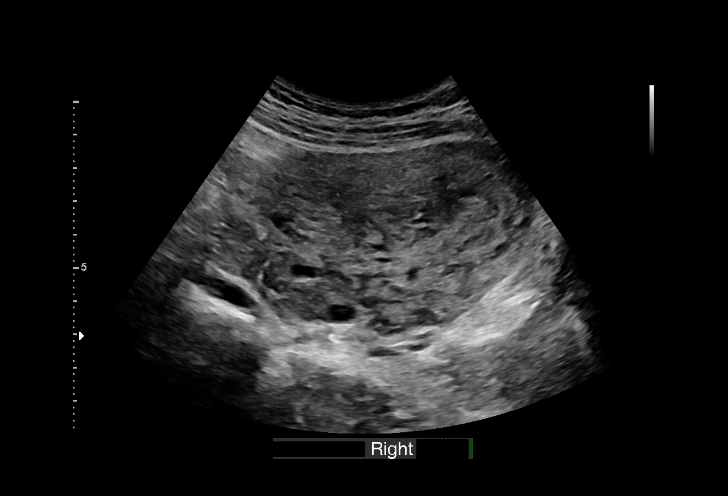
[im 23/37]
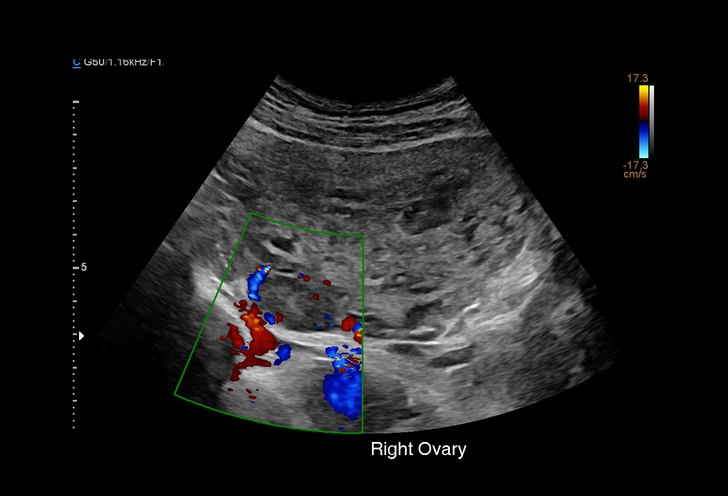
[im 26/37]
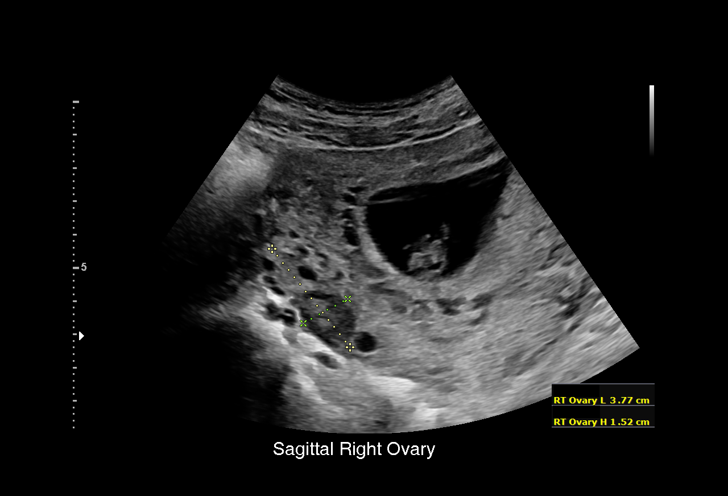
[im 29/37]
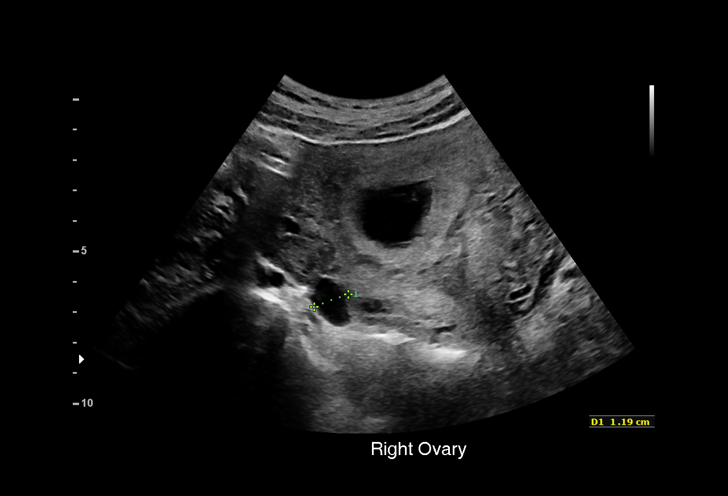
[im 31/37]
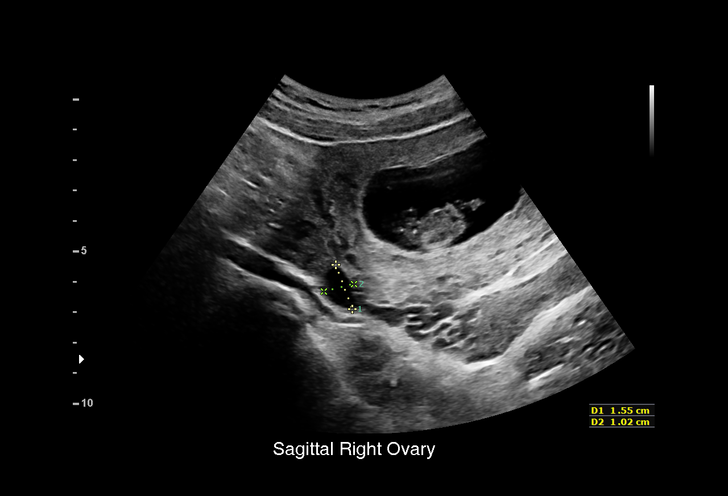
[im 34/37]
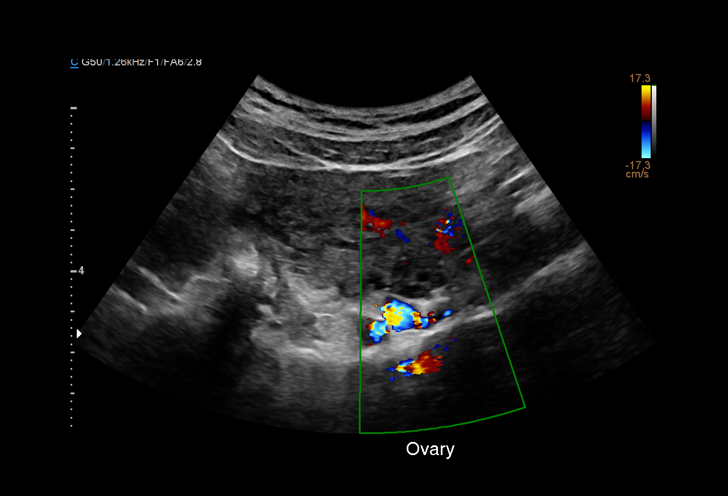
[im 37/37]
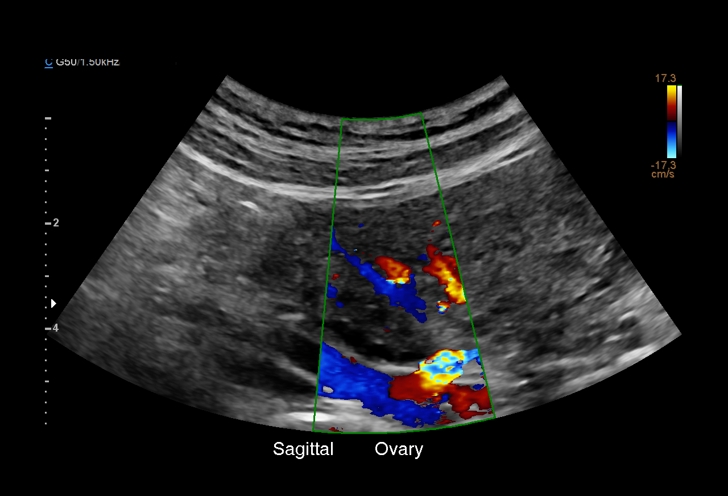

[15 of 28 positions shown; findings below may reference images not displayed]

FINDINGS: Intrauterine gestational sac: Single

Yolk sac:  Visualized.

Embryo:  Visualized.

Cardiac Activity: Visualized.

Heart Rate: 173 bpm

CRL:   32  mm   10 w 1 d                  US EDC: 10/30/2016

Subchorionic hemorrhage:  None visualized.

Maternal uterus/adnexae: Small right ovarian corpus luteum again
noted. A small simple right paraovarian cysts is again seen
measuring 1.6 cm. Normal appearance of left ovary. No suspicious
adnexal mass or free fluid identified.
IMPRESSION: Single living IUP with assigned gestational age of 10 weeks 0 days
by prior ultrasound. Appropriate interval growth.

No significant maternal uterine or adnexal abnormality identified.

## 2018-09-22 IMAGING — US US MFM FETAL NUCHAL TRANSLUCENCY
1 series · 15 of 28 positions shown · non-contrast
Comparison: none

[Series 1: us mfm fetal nuchal translucency · 15 of 32 slices shown]
[im 1/32]
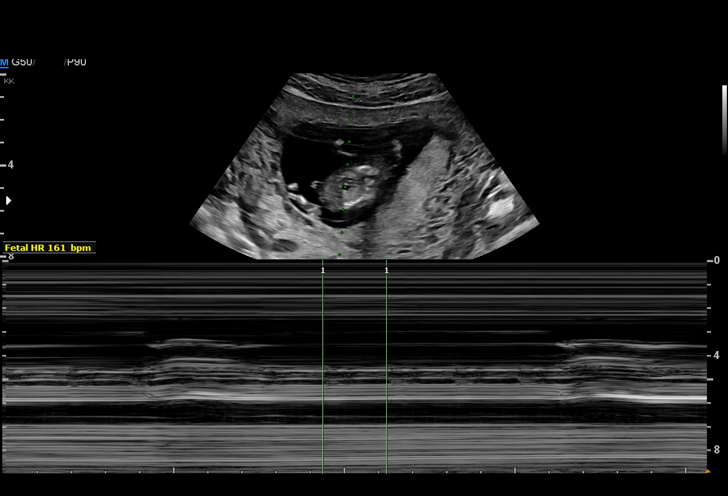
[im 3/32]
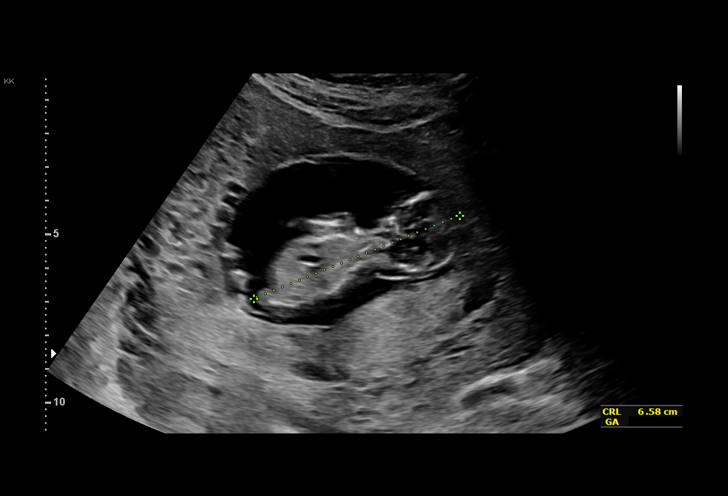
[im 5/32]
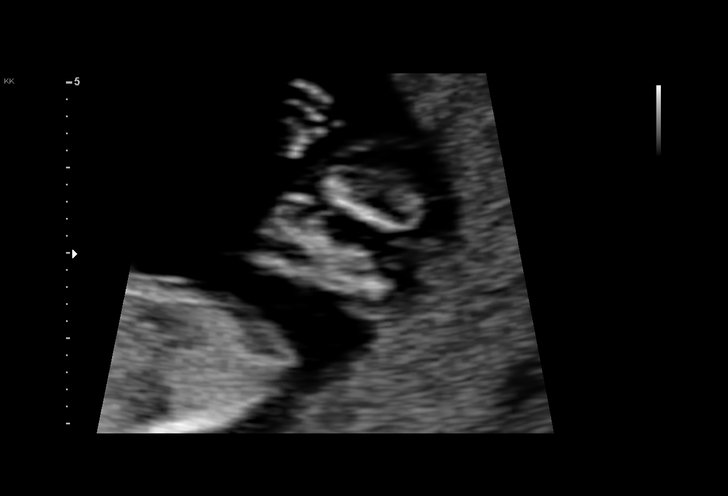
[im 7/32]
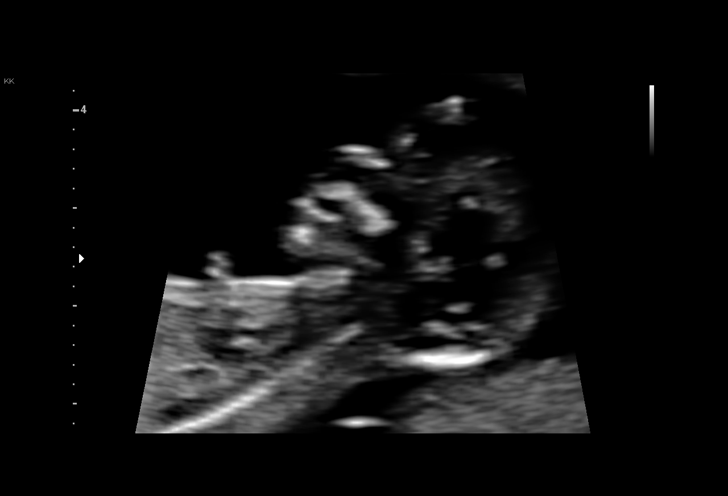
[im 10/32]
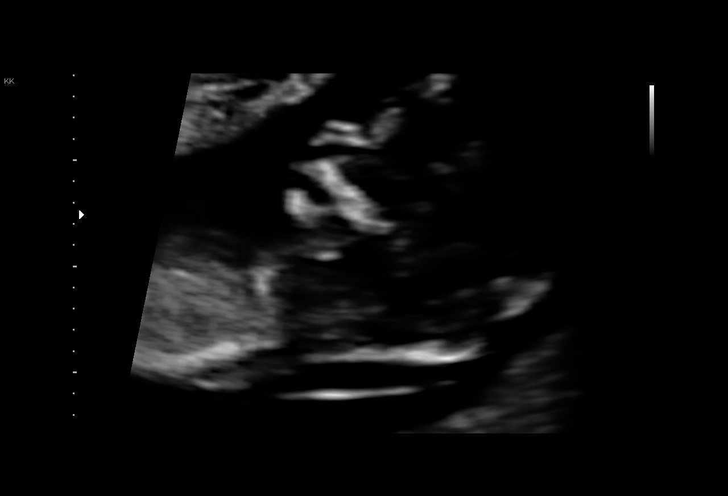
[im 12/32]
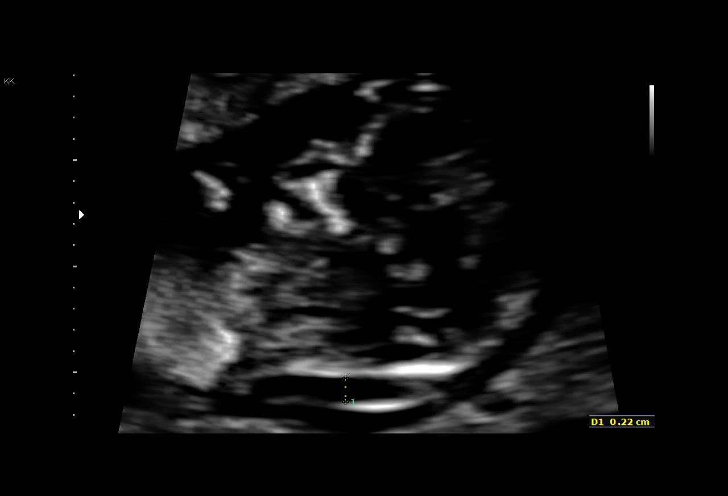
[im 14/32]
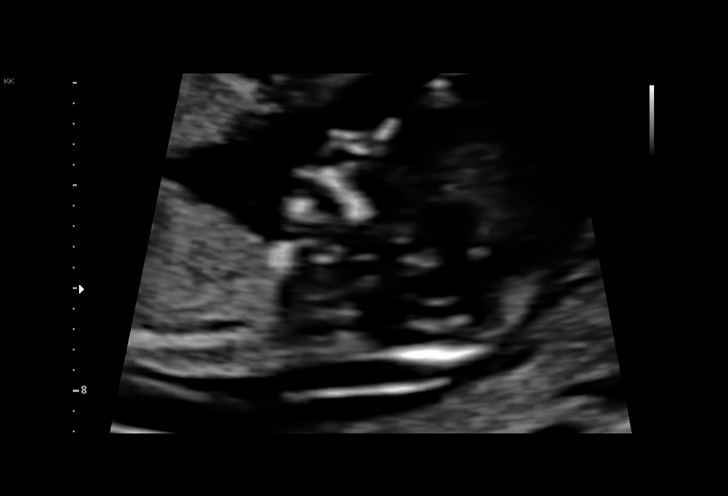
[im 17/32]
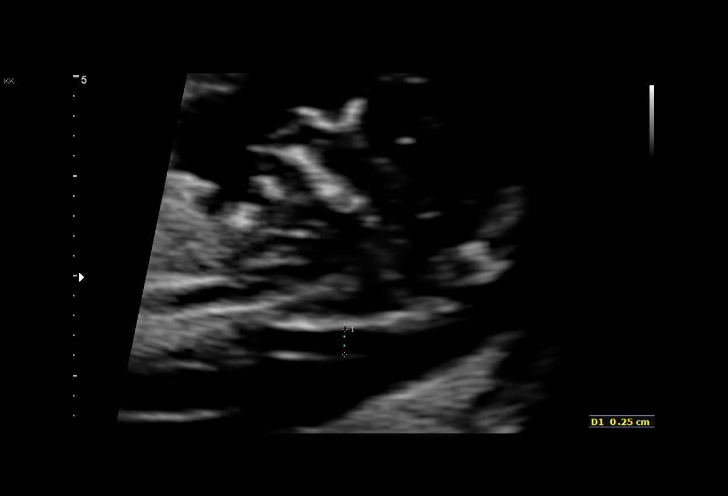
[im 18/32]
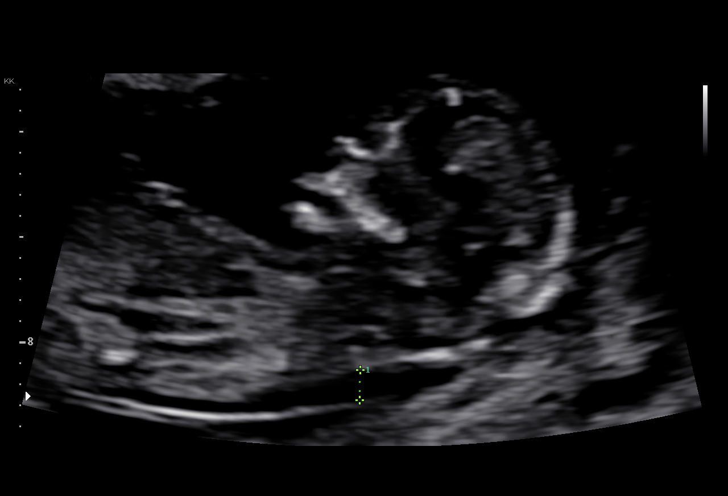
[im 20/32]
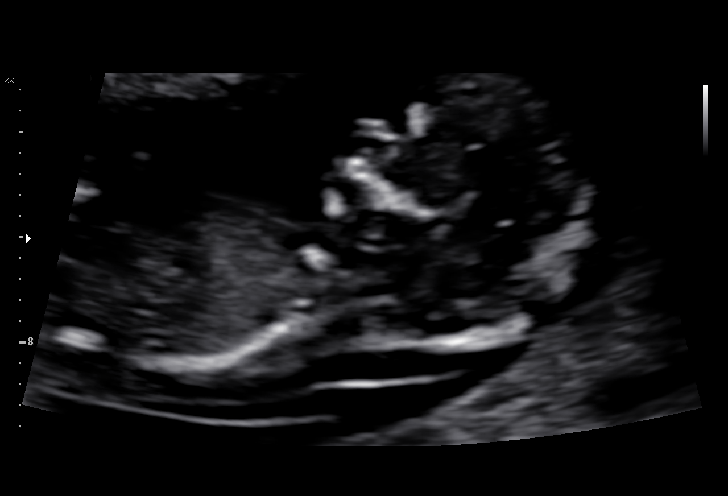
[im 22/32]
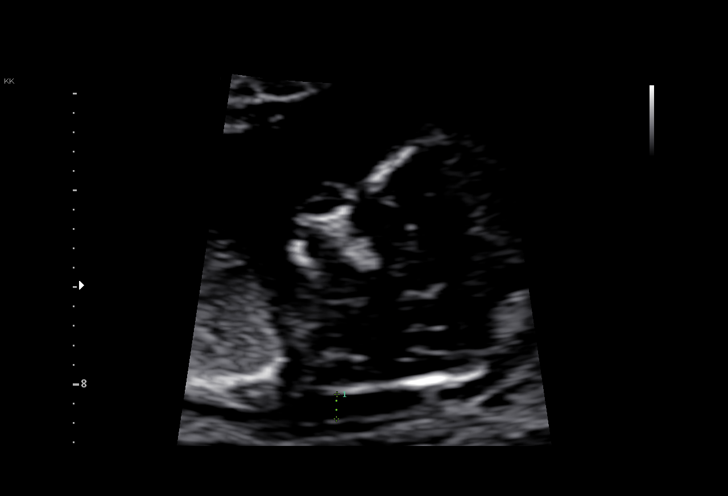
[im 25/32]
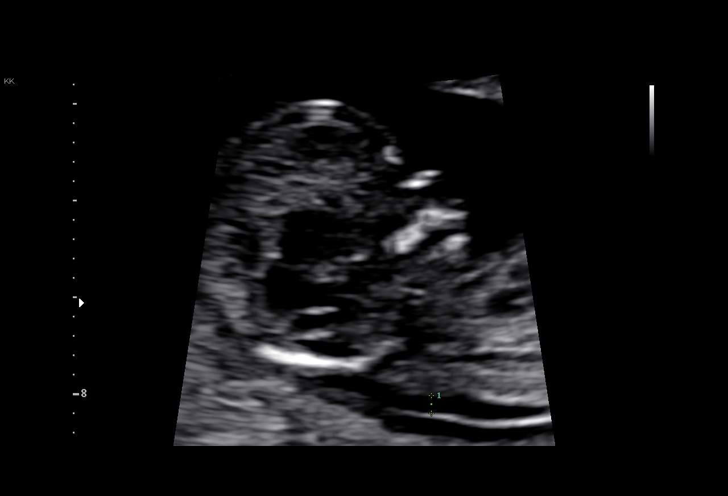
[im 27/32]
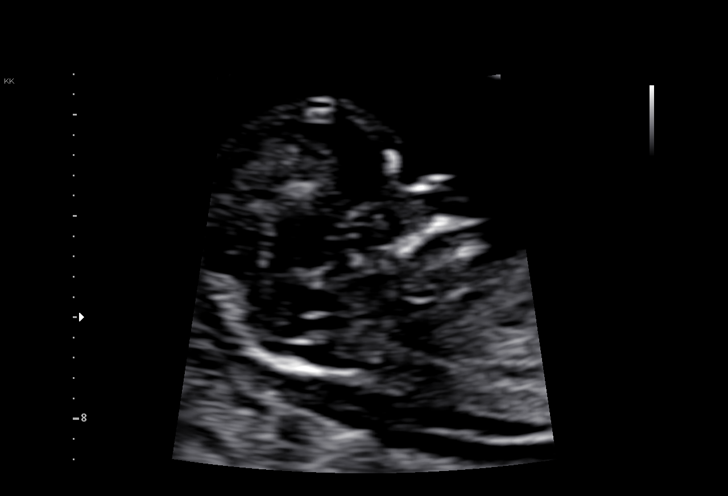
[im 29/32]
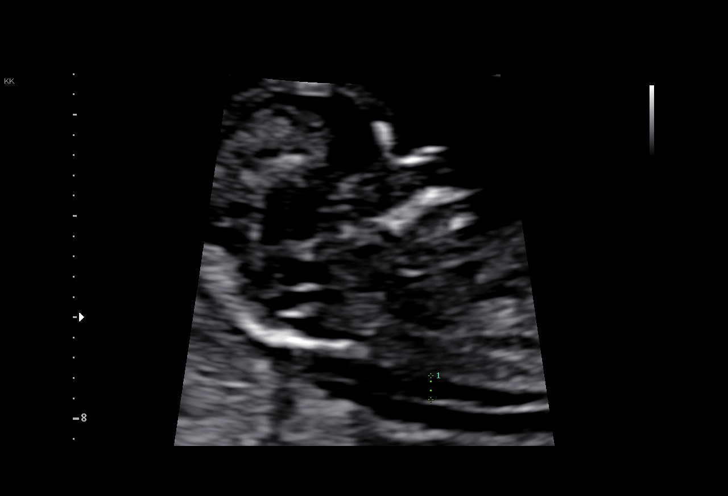
[im 32/32]
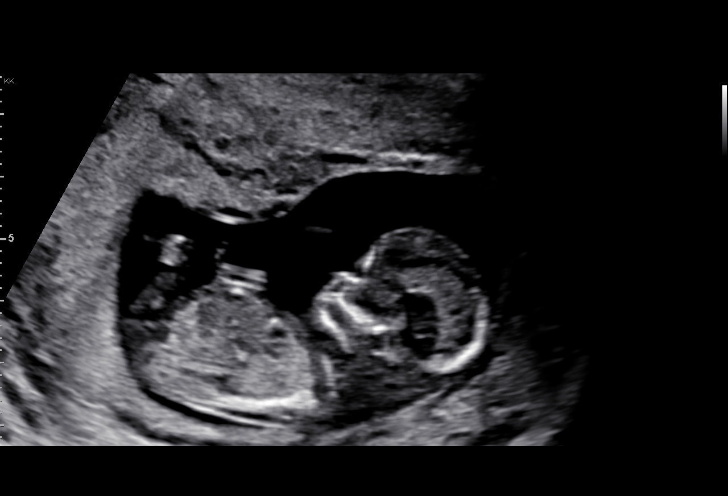

[15 of 28 positions shown; findings below may reference images not displayed]

[facility] [HOSP[facility]]

TRANSLUCENCY

1  KAKI JIM         [id number]      [id number]     [id number]
Indications

12 weeks gestation of pregnancy
Encounter for nuchal translucency
OB History

Blood Type:            Height:         Weight (lb):  105       BMI:
Fetal Evaluation

Num Of Fetuses:     1
Fetal Heart         161
Rate(bpm):
Cardiac Activity:   Observed
Presentation:       Variable

Amniotic Fluid
AFI FV:      Subjectively within normal limits
Gestational Age

LMP:           12w 3d        Date:  01/25/16                 EDD:    10/31/16
Best:          12w 3d     Det. By:  LMP  (01/25/16)          EDD:    10/31/16
1st Trimester Genetic Sonogram Screening

CRL:            66.3  mm    G. Age:   12w 5d                 EDD:    10/29/16
Nuc Trans:       2.5  mm
Nasal Bone:                 Present
Impression

SIUP at 12+3 weeks
No gross abnormalities identified
NT measurement was within normal limits for this GA (91st
%tile); NB present
Normal amniotic fluid volume
Measurements consistent with LMP dating
Recommendations

Offer MSAFP in the second trimester for ONTD screening
Offer anatomy U/S by 18 weeks

## 2019-01-12 IMAGING — US US MFM OB FOLLOW-UP
1 series · 14 of 28 positions shown · non-contrast
Comparison: none

[Series 1: us mfm ob follow-up · 14 of 55 slices shown]
[im 3/55]
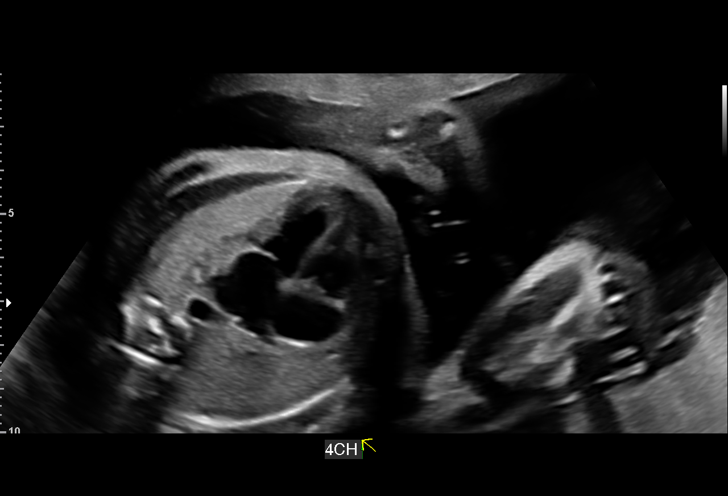
[im 7/55]
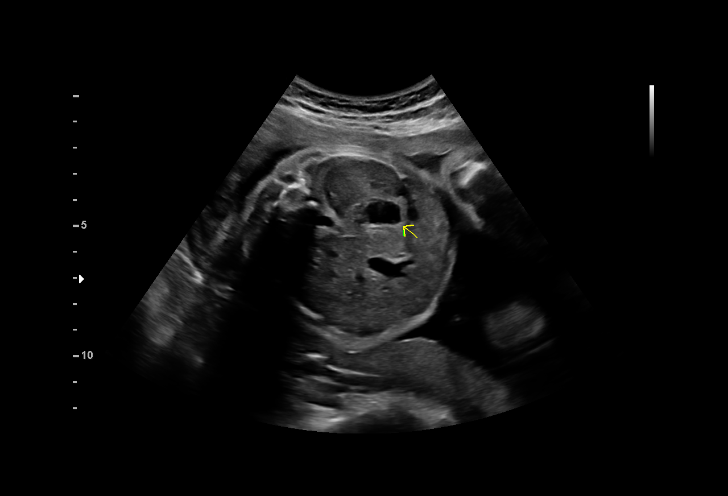
[im 11/55]
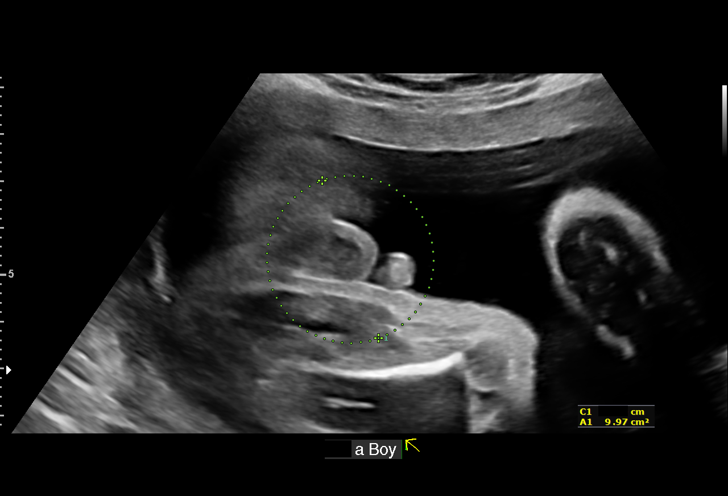
[im 15/55]
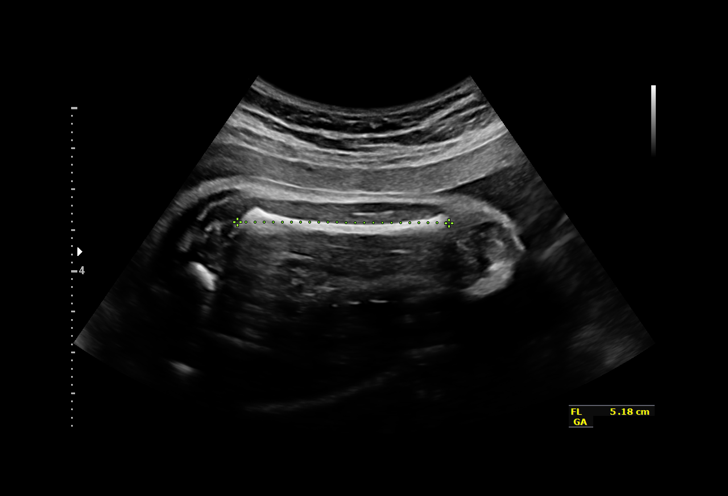
[im 19/55]
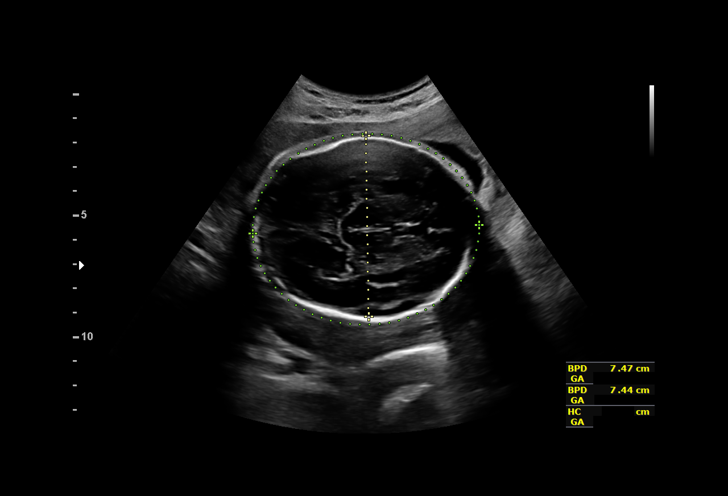
[im 23/55]
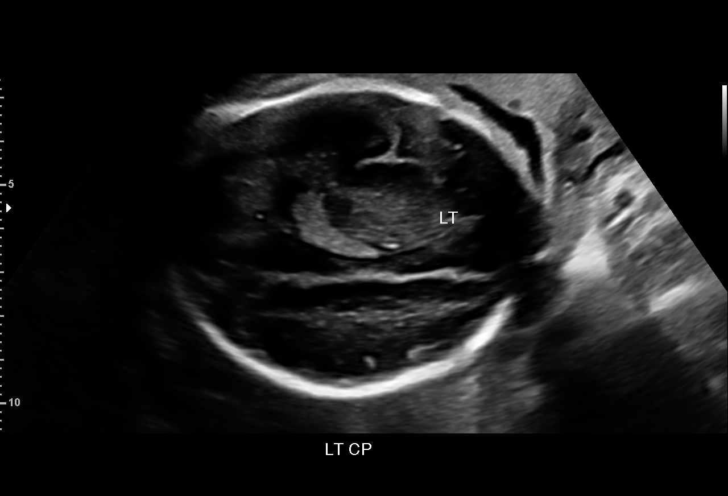
[im 27/55]
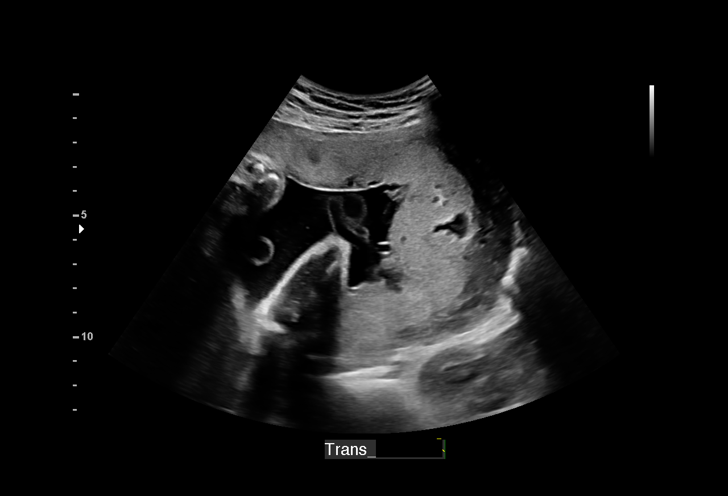
[im 31/55]
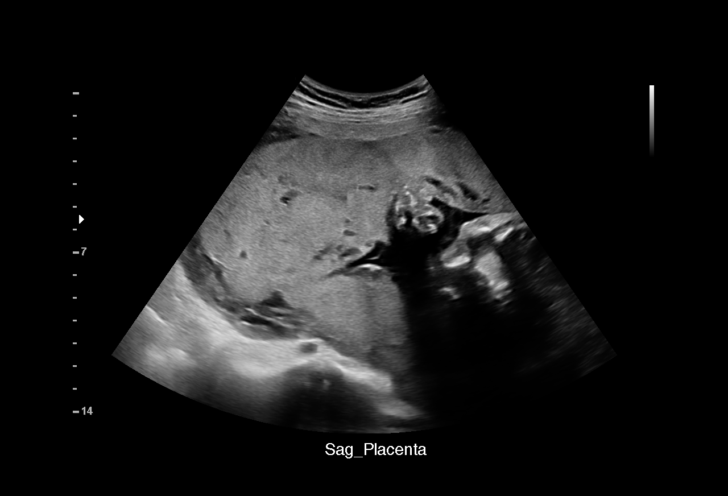
[im 35/55]
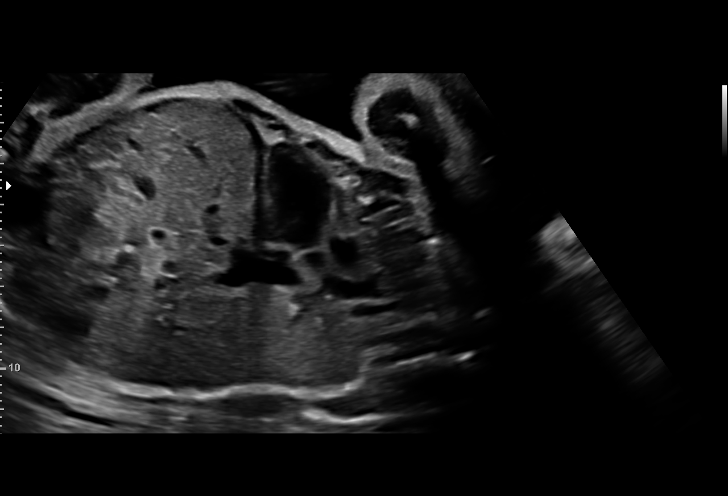
[im 39/55]
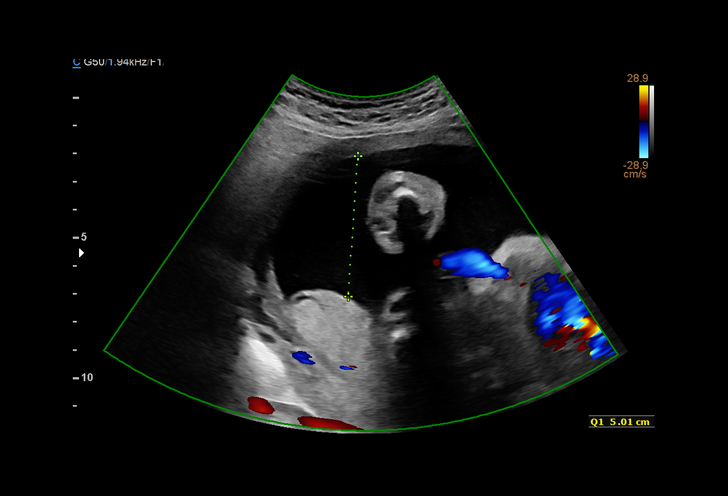
[im 43/55]
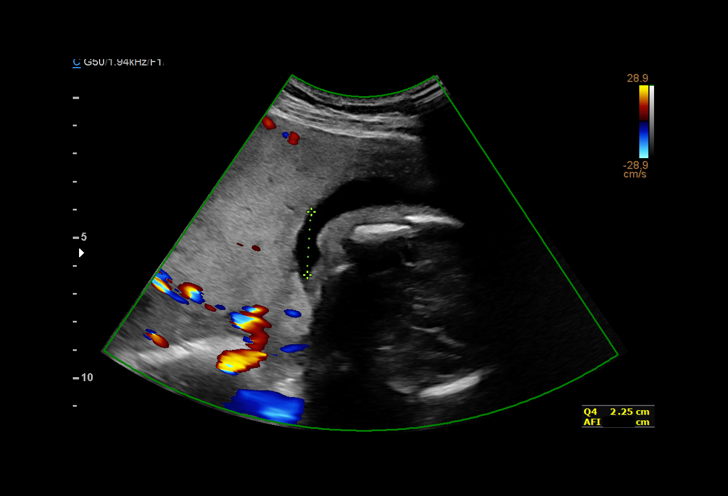
[im 47/55]
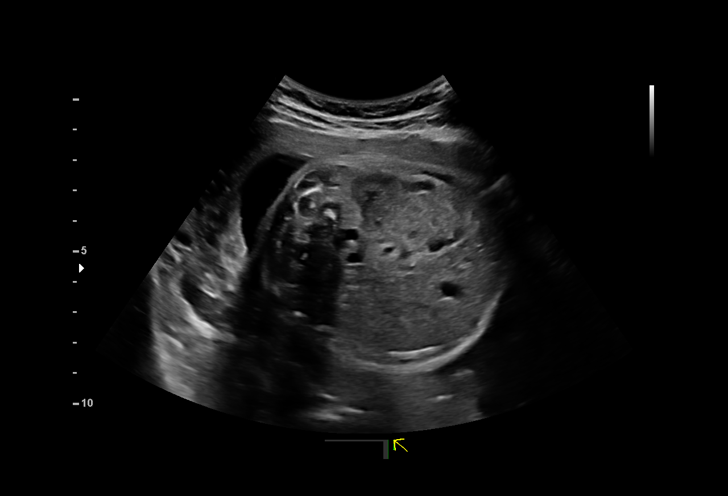
[im 51/55]
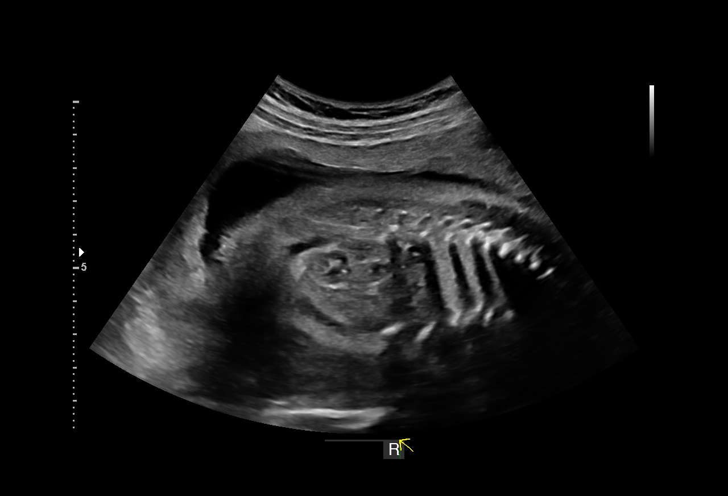
[im 55/55]
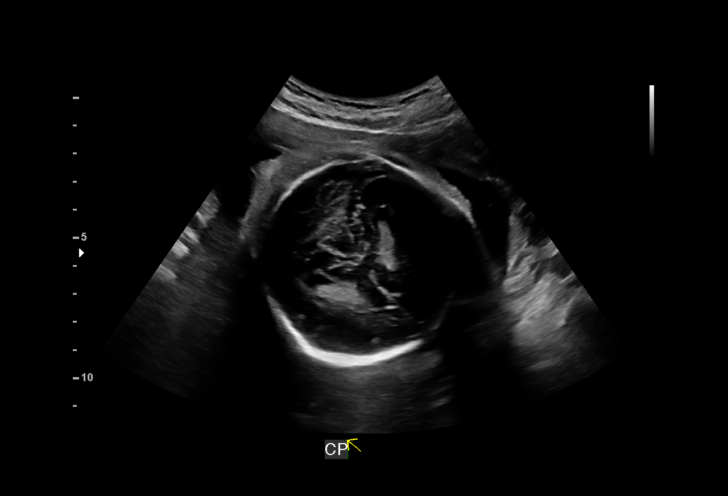

[14 of 28 positions shown; findings below may reference images not displayed]

[facility] [HOSPITAL]

1  SAHORRY MURIL            [id number]      [id number]     [id number]
Indications

28 weeks gestation of pregnancy
Encounter for other antenatal screening
follow-up (CP cyst on prior US)

OB History

Blood Type:            Height:         Weight (lb):  105      BMI:
Fetal Evaluation

Num Of Fetuses:     1
Fetal Heart         154
Rate(bpm):
Cardiac Activity:   Observed
Presentation:       Cephalic
Placenta:           Left lateral, above cervical os
P. Cord Insertion:  Previously Visualized

Amniotic Fluid
AFI FV:      Subjectively within normal limits

AFI Sum(cm)     %Tile       Largest Pocket(cm)
13.54           41

RUQ(cm)       RLQ(cm)       LUQ(cm)        LLQ(cm)
5.01
Biometry

BPD:      74.4  mm     G. Age:  29w 6d         81  %    CI:        76.75   %   70 - 86
FL/HC:      19.0   %   18.8 -
HC:       269   mm     G. Age:  29w 2d         46  %    HC/AC:      1.08       1.05 -
AC:      248.6  mm     G. Age:  29w 0d         63  %    FL/BPD:     68.8   %   71 - 87
FL:       51.2  mm     G. Age:  27w 3d         12  %    FL/AC:      20.6   %   20 - 24
HUM:      46.5  mm     G. Age:  27w 3d         23  %

Est. FW:    2633  gm    2 lb 12 oz      54  %
Gestational Age

LMP:           28w 3d       Date:   01/25/16                 EDD:   10/31/16
U/S Today:     28w 6d                                        EDD:   10/28/16
Best:          28w 3d    Det. By:   LMP  (01/25/16)          EDD:   10/31/16
Anatomy

Cranium:               Appears normal         Aortic Arch:            Previously seen
Cavum:                 Appears normal         Ductal Arch:            Previously seen
Ventricles:            Appears normal         Diaphragm:              Appears normal
Choroid Plexus:        Appears normal         Stomach:                Appears normal, left
sided
Cerebellum:            Previously seen        Abdomen:                Appears normal
Posterior Fossa:       Previously seen        Abdominal Wall:         Previously seen
Nuchal Fold:           Previously seen        Cord Vessels:           Previously seen
Face:                  Orbits and profile     Kidneys:                Appear normal
previously seen
Lips:                  Previously seen        Bladder:                Appears normal
Thoracic:              Appears normal         Spine:                  Previously seen
Heart:                 Appears normal         Upper Extremities:      Previously seen
(4CH, axis, and situs
RVOT:                  Previously seen        Lower Extremities:      Previously seen
LVOT:                  Appears normal

Other:  Male gender. .Heels and LT 5th digit previously visualized.
Cervix Uterus Adnexa

Cervix
Length:           3.52  cm.
Normal appearance by transabdominal scan.
Impression

SIUP at 28+3 weeks
Normal interval anatomy; anatomic survey complete; CP cyst
resolved
Normal amniotic fluid volume
Appropriate interval growth with EFW at the 54th %tile
Recommendations

Follow-up as clinically indicated

## 2019-05-23 LAB — OB RESULTS CONSOLE GC/CHLAMYDIA
Chlamydia: NEGATIVE
Gonorrhea: NEGATIVE

## 2019-05-23 LAB — OB RESULTS CONSOLE RUBELLA ANTIBODY, IGM: Rubella: IMMUNE

## 2019-05-23 LAB — OB RESULTS CONSOLE HIV ANTIBODY (ROUTINE TESTING): HIV: NONREACTIVE

## 2019-07-26 LAB — OB RESULTS CONSOLE GBS: GBS: POSITIVE

## 2019-08-07 ENCOUNTER — Other Ambulatory Visit: Payer: Self-pay

## 2019-08-07 ENCOUNTER — Inpatient Hospital Stay (HOSPITAL_COMMUNITY)
Admission: AD | Admit: 2019-08-07 | Discharge: 2019-08-09 | DRG: 807 | Disposition: A | Discharge status: Home / Self Care | Payer: Medicaid Other | Attending: Obstetrics and Gynecology | Admitting: Obstetrics and Gynecology

## 2019-08-07 ENCOUNTER — Encounter (HOSPITAL_COMMUNITY): Payer: Self-pay

## 2019-08-07 ENCOUNTER — Inpatient Hospital Stay (HOSPITAL_COMMUNITY): Payer: Medicaid Other | Admitting: Anesthesiology

## 2019-08-07 DIAGNOSIS — O26893 Other specified pregnancy related conditions, third trimester: Secondary | ICD-10-CM | POA: Diagnosis present

## 2019-08-07 DIAGNOSIS — Z3A39 39 weeks gestation of pregnancy: Secondary | ICD-10-CM | POA: Diagnosis not present

## 2019-08-07 DIAGNOSIS — O0930 Supervision of pregnancy with insufficient antenatal care, unspecified trimester: Secondary | ICD-10-CM

## 2019-08-07 DIAGNOSIS — O99824 Streptococcus B carrier state complicating childbirth: Secondary | ICD-10-CM | POA: Diagnosis present

## 2019-08-07 DIAGNOSIS — Z20822 Contact with and (suspected) exposure to covid-19: Secondary | ICD-10-CM | POA: Diagnosis present

## 2019-08-07 LAB — CBC
HCT: 40.6 % (ref 36.0–46.0)
Hemoglobin: 13.3 g/dL (ref 12.0–15.0)
MCH: 28.7 pg (ref 26.0–34.0)
MCHC: 32.8 g/dL (ref 30.0–36.0)
MCV: 87.5 fL (ref 80.0–100.0)
Platelets: 199 10*3/uL (ref 150–400)
RBC: 4.64 MIL/uL (ref 3.87–5.11)
RDW: 14.1 % (ref 11.5–15.5)
WBC: 6.9 10*3/uL (ref 4.0–10.5)
nRBC: 0 % (ref 0.0–0.2)

## 2019-08-07 LAB — TYPE AND SCREEN
ABO/RH(D): O POS
Antibody Screen: NEGATIVE

## 2019-08-07 LAB — SARS CORONAVIRUS 2 BY RT PCR (HOSPITAL ORDER, PERFORMED IN ~~LOC~~ HOSPITAL LAB): SARS Coronavirus 2: NEGATIVE

## 2019-08-07 MED ORDER — LIDOCAINE HCL (PF) 1 % IJ SOLN
INTRAMUSCULAR | Status: DC | PRN
  Administered 2019-08-07: 5 mL via EPIDURAL

## 2019-08-07 MED ORDER — TETANUS-DIPHTH-ACELL PERTUSSIS 5-2.5-18.5 LF-MCG/0.5 IM SUSP: 0.5000 mL | Freq: Once | INTRAMUSCULAR | Status: DC

## 2019-08-07 MED ORDER — FENTANYL-BUPIVACAINE-NACL 0.5-0.125-0.9 MG/250ML-% EP SOLN
12.0000 mL/h | EPIDURAL | Status: DC | PRN
  Filled 2019-08-07: qty 250

## 2019-08-07 MED ORDER — SODIUM CHLORIDE (PF) 0.9 % IJ SOLN
INTRAMUSCULAR | Status: DC | PRN
  Administered 2019-08-07: 12 mL/h via EPIDURAL

## 2019-08-07 MED ORDER — DIPHENHYDRAMINE HCL 25 MG PO CAPS
25.0000 mg | ORAL_CAPSULE | Freq: Four times a day (QID) | ORAL | Status: DC | PRN
  Administered 2019-08-07: 25 mg via ORAL
  Filled 2019-08-07: qty 1

## 2019-08-07 MED ORDER — EPHEDRINE 5 MG/ML INJ: 10.0000 mg | INTRAVENOUS | Status: DC | PRN

## 2019-08-07 MED ORDER — LACTATED RINGERS IV SOLN: 500.0000 mL | INTRAVENOUS | Status: DC | PRN

## 2019-08-07 MED ORDER — SOD CITRATE-CITRIC ACID 500-334 MG/5ML PO SOLN: 30.0000 mL | ORAL | Status: DC | PRN

## 2019-08-07 MED ORDER — SENNOSIDES-DOCUSATE SODIUM 8.6-50 MG PO TABS
2.0000 | ORAL_TABLET | ORAL | Status: DC
  Administered 2019-08-07 – 2019-08-08 (×2): 2 via ORAL
  Filled 2019-08-07 (×2): qty 2

## 2019-08-07 MED ORDER — ACETAMINOPHEN 325 MG PO TABS
650.0000 mg | ORAL_TABLET | ORAL | Status: DC | PRN
  Administered 2019-08-08: 650 mg via ORAL
  Filled 2019-08-07: qty 2

## 2019-08-07 MED ORDER — TERBUTALINE SULFATE 1 MG/ML IJ SOLN: 0.2500 mg | Freq: Once | INTRAMUSCULAR | Status: DC | PRN

## 2019-08-07 MED ORDER — PRENATAL MULTIVITAMIN CH
1.0000 | ORAL_TABLET | Freq: Every day | ORAL | Status: DC
  Administered 2019-08-08: 1 via ORAL
  Filled 2019-08-07: qty 1

## 2019-08-07 MED ORDER — ONDANSETRON HCL 4 MG PO TABS: 4.0000 mg | ORAL_TABLET | ORAL | Status: DC | PRN

## 2019-08-07 MED ORDER — ONDANSETRON HCL 4 MG/2ML IJ SOLN: 4.0000 mg | Freq: Four times a day (QID) | INTRAMUSCULAR | Status: DC | PRN

## 2019-08-07 MED ORDER — DIBUCAINE (PERIANAL) 1 % EX OINT: 1.0000 "application " | TOPICAL_OINTMENT | CUTANEOUS | Status: DC | PRN

## 2019-08-07 MED ORDER — DIPHENHYDRAMINE HCL 50 MG/ML IJ SOLN: 12.5000 mg | INTRAMUSCULAR | Status: DC | PRN

## 2019-08-07 MED ORDER — SIMETHICONE 80 MG PO CHEW: 80.0000 mg | CHEWABLE_TABLET | ORAL | Status: DC | PRN

## 2019-08-07 MED ORDER — WITCH HAZEL-GLYCERIN EX PADS: 1.0000 "application " | MEDICATED_PAD | CUTANEOUS | Status: DC | PRN

## 2019-08-07 MED ORDER — OXYTOCIN-SODIUM CHLORIDE 30-0.9 UT/500ML-% IV SOLN
2.5000 [IU]/h | INTRAVENOUS | Status: DC
  Filled 2019-08-07 (×2): qty 500

## 2019-08-07 MED ORDER — PENICILLIN G POT IN DEXTROSE 60000 UNIT/ML IV SOLN
3.0000 10*6.[IU] | INTRAVENOUS | Status: DC
  Administered 2019-08-07: 3 10*6.[IU] via INTRAVENOUS
  Filled 2019-08-07: qty 50

## 2019-08-07 MED ORDER — BENZOCAINE-MENTHOL 20-0.5 % EX AERO
1.0000 "application " | INHALATION_SPRAY | CUTANEOUS | Status: DC | PRN
  Administered 2019-08-08: 1 via TOPICAL
  Filled 2019-08-07: qty 56

## 2019-08-07 MED ORDER — ACETAMINOPHEN 325 MG PO TABS: 650.0000 mg | ORAL_TABLET | ORAL | Status: DC | PRN

## 2019-08-07 MED ORDER — ONDANSETRON HCL 4 MG/2ML IJ SOLN: 4.0000 mg | INTRAMUSCULAR | Status: DC | PRN

## 2019-08-07 MED ORDER — PHENYLEPHRINE 40 MCG/ML (10ML) SYRINGE FOR IV PUSH (FOR BLOOD PRESSURE SUPPORT)
80.0000 ug | PREFILLED_SYRINGE | INTRAVENOUS | Status: DC | PRN
  Administered 2019-08-07 (×2): 80 ug via INTRAVENOUS

## 2019-08-07 MED ORDER — PHENYLEPHRINE 40 MCG/ML (10ML) SYRINGE FOR IV PUSH (FOR BLOOD PRESSURE SUPPORT)
80.0000 ug | PREFILLED_SYRINGE | INTRAVENOUS | Status: DC | PRN
  Filled 2019-08-07: qty 10

## 2019-08-07 MED ORDER — FENTANYL CITRATE (PF) 100 MCG/2ML IJ SOLN
50.0000 ug | INTRAMUSCULAR | Status: DC | PRN
  Administered 2019-08-07 (×2): 50 ug via INTRAVENOUS
  Filled 2019-08-07: qty 2

## 2019-08-07 MED ORDER — OXYTOCIN-SODIUM CHLORIDE 30-0.9 UT/500ML-% IV SOLN
1.0000 m[IU]/min | INTRAVENOUS | Status: DC
  Administered 2019-08-07: 2 m[IU]/min via INTRAVENOUS

## 2019-08-07 MED ORDER — LACTATED RINGERS IV SOLN: INTRAVENOUS | Status: DC

## 2019-08-07 MED ORDER — SODIUM CHLORIDE 0.9 % IV SOLN
5.0000 10*6.[IU] | Freq: Once | INTRAVENOUS | Status: AC
  Administered 2019-08-07: 5 10*6.[IU] via INTRAVENOUS
  Filled 2019-08-07: qty 5

## 2019-08-07 MED ORDER — LIDOCAINE HCL (PF) 1 % IJ SOLN: 30.0000 mL | INTRAMUSCULAR | Status: DC | PRN

## 2019-08-07 MED ORDER — IBUPROFEN 600 MG PO TABS
600.0000 mg | ORAL_TABLET | Freq: Four times a day (QID) | ORAL | Status: DC
  Administered 2019-08-07 – 2019-08-09 (×7): 600 mg via ORAL
  Filled 2019-08-07 (×8): qty 1

## 2019-08-07 MED ORDER — OXYCODONE-ACETAMINOPHEN 5-325 MG PO TABS: 2.0000 | ORAL_TABLET | ORAL | Status: DC | PRN

## 2019-08-07 MED ORDER — OXYTOCIN BOLUS FROM INFUSION
500.0000 mL | Freq: Once | INTRAVENOUS | Status: AC
  Administered 2019-08-07: 500 mL via INTRAVENOUS

## 2019-08-07 MED ORDER — LACTATED RINGERS IV SOLN
500.0000 mL | Freq: Once | INTRAVENOUS | Status: AC
  Administered 2019-08-07: 500 mL via INTRAVENOUS

## 2019-08-07 MED ORDER — COCONUT OIL OIL: 1.0000 "application " | TOPICAL_OIL | Status: DC | PRN

## 2019-08-07 NOTE — H&P (Signed)
OB ADMISSION/ HISTORY & PHYSICAL:  Admission Date: 08/07/2019  8:55 AM  Admit Diagnosis: Latent labor  Jacqueline Gates is a 29 y.o. female G3P2002 [redacted]w[redacted]d presenting for ctx. Endorses active FM, denies LOF and vaginal bleeding. Ctx began @ 0800 and increased in intensity. Cervical change from 1 to 4 cm while being observed in MAU. Admitted in latent labor. Hx of SVD x2, uncomplicated, pelvis proven to 7# 9oz, denies hx of shoulder dystocia. Hx of third degree lac w/ first preg, intact w/ second preg.  History of current pregnancy: [id number]   Patient entered care late with [facility] at 28+1 wks.   EDC of 08/14/19 was established by [place] @28 +1  Anatomy scan:  28+1 wks, with normal findings and posterior placenta.   Antenatal testing: N.A Last evaluation: 34+1 wks VTX, POSTERIOR LEFT PLACENTA, AFI 9.7, EFW 5 LBS, 32%ILE  Significant prenatal events:  Patient Active Problem List   Diagnosis Date Noted   Normal labor 08/07/2019   Late prenatal care 08/07/2019   Language barrier 07/21/2016    Prenatal Labs: ABO, Rh: --/--/O POS (06/06 1115) Antibody: NEG (06/06 1115) Rubella: Immune (03/22 0000)  RPR:   NR  HBsAg:   NR HIV:   NEG  GTT: Passed 1 hr  GBS: Positive/-- (05/25 0000)  GC/CHL: NEG/NEG Genetics: Low risk female    OB History  Gravida Para Term Preterm AB Living  3 2 2     2   SAB TAB Ectopic Multiple Live Births        0 2    # Outcome Date GA Lbr Len/2nd Weight Sex Delivery Anes PTL Lv  3 Current           2 Term 04/28/18    Vag-Spont     1 Term 10/20/16 [redacted]w[redacted]d 25:53 / 00:38 3400 g M Vag-Spont EPI  LIV    Medical / Surgical History: Past medical history:  Past Medical History:  Diagnosis Date   Medical history non-contributory     Past surgical history:  Past Surgical History:  Procedure Laterality Date   NO PAST SURGERIES     Family History: History reviewed. No pertinent family history.  Social History:  reports that she has never smoked. She has never  used smokeless tobacco. She reports that she does not drink alcohol or use drugs.  Allergies: Patient has no known allergies.   Current Medications at time of admission:  Prior to Admission medications   Medication Sig Start Date End Date Taking? Authorizing Provider  Prenatal MV-Min-FA-Omega-3 (PRENATAL GUMMIES/DHA & FA) 0.4-32.5 MG CHEW Chew 1 each by mouth daily. 03/04/18  Yes Rasch, 03-07-2000 I, NP  ibuprofen (ADVIL,MOTRIN) 800 MG tablet Take 1 tablet (800 mg total) by mouth 3 (three) times daily. 04/30/18   Victorino Dike, DO  senna-docusate (SENOKOT-S) 8.6-50 MG tablet Take 2 tablets by mouth at bedtime as needed for mild constipation. 04/30/18   06-09-1978, DO    Review of Systems: Constitutional: Negative   HENT: Negative   Eyes: Negative   Respiratory: Negative   Cardiovascular: Negative   Gastrointestinal: Negative  Genitourinary: pos for bloody show, neg for LOF   Musculoskeletal: Negative   Skin: Negative   Neurological: Negative   Endo/Heme/Allergies: Negative   Psychiatric/Behavioral: Negative    Physical Exam: VS: Blood pressure 108/69, pulse 81, temperature 97.7 F (36.5 C), resp. rate 16, SpO2 100 %, unknown if currently breastfeeding. AAO x3, no signs of distress GU/GI: Abdomen gravid, non-tender, non-distended,  active FM, vertex, EFW 7# per Leopold's Extremities: no edema, negative for pain, tenderness, and cords  Cervical exam:Dilation: 4 Effacement (%): 70 Station: -2 Exam by:: Fredda Hammed RN  FHR: baseline rate 140 / variability moderate / accelerations present / absent decelerations TOCO: 3-4 min   Prenatal Transfer Tool  Maternal Diabetes: No Genetic Screening: Normal Maternal Ultrasounds/Referrals: Normal Fetal Ultrasounds or other Referrals:  None Maternal Substance Abuse:  No Significant Maternal Medications:  None Significant Maternal Lab Results: Group B Strep positive    Assessment: 29 y.o. P2T6244 [redacted]w[redacted]d  Latent stage of  labor FHR category 1 GBS positive Pain management plan: epidural   Plan:  Admit to L&D Routine admission orders Epidural PRN PCN for GBS prophylaxis Dr Charlesetta Garibaldi notified of admission and plan of care  Arrie Eastern MSN, CNM 08/07/2019 11:15 AM

## 2019-08-07 NOTE — Anesthesia Procedure Notes (Signed)
Epidural Patient location during procedure: OB Start time: 08/07/2019 12:50 PM End time: 08/07/2019 1:02 PM  Staffing Anesthesiologist: Trevor Iha, MD Performed: anesthesiologist   Preanesthetic Checklist Completed: patient identified, IV checked, site marked, risks and benefits discussed, surgical consent, monitors and equipment checked, pre-op evaluation and timeout performed  Epidural Patient position: sitting Prep: DuraPrep and site prepped and draped Patient monitoring: continuous pulse ox and blood pressure Approach: midline Location: L2-L3 Injection technique: LOR air  Needle:  Needle type: Tuohy  Needle gauge: 17 G Needle length: 9 cm and 9 Needle insertion depth: 5 cm cm Catheter type: closed end flexible Catheter size: 19 Gauge Catheter at skin depth: 10 cm Test dose: negative  Assessment Events: blood not aspirated, injection not painful, no injection resistance, no paresthesia and negative IV test  Additional Notes Patient identified. Risks/Benefits/Options discussed with patient including but not limited to bleeding, infection, nerve damage, paralysis, failed block, incomplete pain control, headache, blood pressure changes, nausea, vomiting, reactions to medication both or allergic, itching and postpartum back pain. Confirmed with bedside nurse the patient's most recent platelet count. Confirmed with patient that they are not currently taking any anticoagulation, have any bleeding history or any family history of bleeding disorders. Patient expressed understanding and wished to proceed. All questions were answered. Sterile technique was used throughout the entire procedure. Please see nursing notes for vital signs. Test dose was given through epidural needle and negative prior to continuing to dose epidural or start infusion. Warning signs of high block given to the patient including shortness of breath, tingling/numbness in hands, complete motor block, or any  concerning symptoms with instructions to call for help. Patient was given instructions on fall risk and not to get out of bed. All questions and concerns addressed with instructions to call with any issues.  1 Attempt (S) . Patient tolerated procedure well.

## 2019-08-07 NOTE — Progress Notes (Signed)
Delivery Note Labor onset: 08/07/2019  Labor Onset Time: Complete dilation at 4:22 PM  Onset of pushing at 1630 FHR second stage Cat 1 Analgesia/Anesthesia intrapartum: Epidural  Guided pushing with maternal urge. Delivery of a viable female at 1640. Fetal head delivered in direct OA position and restituted to LOA. Nuchal cord N/A. Infant placed on maternal abd, dried, and tactile stim to produce lusty cry/  Cord double clamped and cut after 1 min by CNM, father and mother declined to cut cord.  Cord blood sample collected Arterial cord blood sample N/A.  Placenta delivered Jacqueline Gates, intact, with 3 VC.  Placenta to L&D. Uterine tone firm bleeding small, no clots  No laceration identified.  Anesthesia: N/A Repair: N/A QBL (mL): 64 Complications: none APGAR: APGAR (1 MIN): 8   APGAR (5 MINS):   APGAR (10 MINS):   Mom to postpartum.  Baby to Couplet care / Skin to Skin  Adequate treatment for GBS Girl "Jacqueline Gates"  Roma Schanz MSN, CNM 08/07/2019, 5:02 PM

## 2019-08-07 NOTE — MAU Note (Signed)
.   Jacqueline Gates is a 29 y.o. at [redacted]w[redacted]d here in MAU reporting: contractions that started at 8 this morning with bloody show. Denies any LOF  Onset of complaint: 0800 Pain score: 5 Vitals:   08/07/19 0918  BP: 108/69  Pulse: 81  Resp: 16  Temp: 97.7 F (36.5 C)  SpO2: 100%     FHT:140 Lab orders placed from triage:

## 2019-08-07 NOTE — Anesthesia Preprocedure Evaluation (Signed)
Anesthesia Evaluation  Patient identified by MRN, date of birth, ID band Patient awake    Reviewed: Allergy & Precautions, NPO status , Patient's Chart, lab work & pertinent test results  Airway Mallampati: II  TM Distance: >3 FB Neck ROM: Full    Dental no notable dental hx.    Pulmonary neg pulmonary ROS,    Pulmonary exam normal breath sounds clear to auscultation       Cardiovascular Exercise Tolerance: Good negative cardio ROS Normal cardiovascular exam Rhythm:Regular Rate:Normal     Neuro/Psych negative neurological ROS     GI/Hepatic negative GI ROS, Neg liver ROS,   Endo/Other  negative endocrine ROS  Renal/GU negative Renal ROS     Musculoskeletal negative musculoskeletal ROS (+)   Abdominal   Peds  Hematology Lab Results      Component                Value               Date                      WBC                      6.9                 08/07/2019                HGB                      13.3                08/07/2019                HCT                      40.6                08/07/2019                MCV                      87.5                08/07/2019                PLT                      199                 08/07/2019              Anesthesia Other Findings Pt Vietnamese Speaking hx taken w Translator  Reproductive/Obstetrics (+) Pregnancy                            Anesthesia Physical Anesthesia Plan  ASA: II  Anesthesia Plan: Epidural   Post-op Pain Management:    Induction:   PONV Risk Score and Plan:   Airway Management Planned:   Additional Equipment:   Intra-op Plan:   Post-operative Plan:   Informed Consent: I have reviewed the patients History and Physical, chart, labs and discussed the procedure including the risks, benefits and alternatives for the proposed anesthesia with the patient or authorized representative who has indicated his/her  understanding and acceptance.       Plan  Discussed with:   Anesthesia Plan Comments: (43 wk G3P2 for LEA Pt vietnamese speaking)        Anesthesia Quick Evaluation

## 2019-08-08 LAB — CBC
HCT: 34.4 % — ABNORMAL LOW (ref 36.0–46.0)
Hemoglobin: 11.3 g/dL — ABNORMAL LOW (ref 12.0–15.0)
MCH: 28.5 pg (ref 26.0–34.0)
MCHC: 32.8 g/dL (ref 30.0–36.0)
MCV: 86.6 fL (ref 80.0–100.0)
Platelets: 184 10*3/uL (ref 150–400)
RBC: 3.97 MIL/uL (ref 3.87–5.11)
RDW: 13.8 % (ref 11.5–15.5)
WBC: 9.9 10*3/uL (ref 4.0–10.5)
nRBC: 0 % (ref 0.0–0.2)

## 2019-08-08 LAB — RPR: RPR Ser Ql: NONREACTIVE

## 2019-08-08 NOTE — Progress Notes (Signed)
Subjective: Postpartum Day # 1 : S/P NSVD due to spontaneous latent labor. Patient up ad lib, denies syncope or dizziness. Reports consuming regular diet without issues and denies N/V. Patient reports 0 bowel movement + passing flatus.  Denies issues with urination and reports bleeding is "lighter."  Patient is bottle feeding and reports going well.  Desires IUS for postpartum contraception.  Pain is being appropriately managed with use of po meds.   No laceration Feeding:  Bottle Contraceptive plan:  IUD Baby female  Objective: Vital signs in last 24 hours: Patient Vitals for the past 24 hrs:  BP Temp Temp src Pulse Resp SpO2 Height Weight  08/08/19 0740 103/70 98.3 F (36.8 C) Oral 73 16 99 % -- --  08/08/19 0340 103/64 97.8 F (36.6 C) Oral 64 18 -- -- --  08/07/19 2330 102/72 97.8 F (36.6 C) Oral 71 18 100 % -- --  08/07/19 1940 97/63 98.4 F (36.9 C) Oral 74 18 100 % -- --  08/07/19 1830 106/67 97.7 F (36.5 C) Oral 70 18 -- -- --  08/07/19 1805 -- (!) 97.2 F (36.2 C) Axillary -- -- -- -- --  08/07/19 1801 107/70 -- -- (!) 111 16 -- -- --  08/07/19 1746 91/77 -- -- 70 18 -- -- --  08/07/19 1731 105/66 -- -- 68 16 -- -- --  08/07/19 1720 -- (!) 97.4 F (36.3 C) Oral -- -- -- -- --  08/07/19 1701 104/64 -- -- 70 18 -- -- --  08/07/19 1659 -- (!) 96.5 F (35.8 C) Axillary -- -- -- -- --  08/07/19 1656 108/90 -- -- (!) 118 -- -- -- --  08/07/19 1646 106/78 -- -- -- -- -- -- --  08/07/19 1531 104/63 -- -- 66 -- -- -- --  08/07/19 1501 104/67 -- -- 67 18 -- -- --  08/07/19 1431 96/74 -- -- 71 -- -- -- --  08/07/19 1422 101/72 -- -- 66 18 -- -- --  08/07/19 1417 (!) 88/58 -- -- 68 18 -- -- --  08/07/19 1412 -- -- -- -- 18 -- -- --  08/07/19 1410 -- 97.6 F (36.4 C) Axillary -- -- -- -- --  08/07/19 1403 -- -- -- 67 16 -- -- --  08/07/19 1332 (!) 89/62 -- -- 64 20 -- -- --  08/07/19 1331 -- -- -- -- -- 100 % -- --  08/07/19 1330 98/62 -- -- (!) 58 -- -- -- --  08/07/19  1326 (!) 82/60 -- -- 82 18 99 % -- --  08/07/19 1320 (!) 86/60 -- -- 85 18 98 % -- --  08/07/19 1312 100/70 -- -- 70 18 -- -- --  08/07/19 1311 -- -- -- -- -- 100 % -- --  08/07/19 1306 99/64 -- -- 76 20 99 % -- --  08/07/19 1303 98/69 -- -- 83 -- 99 % -- --  08/07/19 1258 102/72 -- -- 80 -- -- -- --  08/07/19 1256 -- -- -- -- -- 100 % -- --  08/07/19 1203 -- 97.8 F (36.6 C) Oral -- -- -- -- --  08/07/19 1200 105/73 -- -- 69 -- -- -- --  08/07/19 1156 -- -- -- -- -- -- 5\' 3"  (1.6 m) 61.7 kg  08/07/19 1132 102/74 -- -- 75 18 -- -- --     Physical Exam:  General: alert, cooperative, appears stated age and no distress Mood/Affect: Tired Lungs: clear to auscultation, no wheezes, rales or  rhonchi, symmetric air entry.  Heart: normal rate, regular rhythm, normal S1, S2, no murmurs, rubs, clicks or gallops. Breast: breasts appear normal, no suspicious masses, no skin or nipple changes or axillary nodes. Abdomen:  + bowel sounds, soft, non-tender GU: perineum intact, healing well. No signs of external hematomas.  Uterine Fundus: firm Lochia: appropriate Skin: Warm, Dry. DVT Evaluation: No evidence of DVT seen on physical exam. Negative Homan's sign. No cords or calf tenderness. No significant calf/ankle edema.  CBC Latest Ref Rng & Units 08/08/2019 08/07/2019 04/28/2018  WBC 4.0 - 10.5 K/uL 9.9 6.9 9.7  Hemoglobin 12.0 - 15.0 g/dL 11.3(L) 13.3 12.1  Hematocrit 36.0 - 46.0 % 34.4(L) 40.6 38.4  Platelets 150 - 400 K/uL 184 199 240    Results for orders placed or performed during the hospital encounter of 08/07/19 (from the past 24 hour(s))  SARS Coronavirus 2 by RT PCR (hospital order, performed in [facility] hospital lab) Nasopharyngeal Nasopharyngeal Swab     Status: None   Collection Time: 08/07/19 11:13 AM   Specimen: Nasopharyngeal Swab  Result Value Ref Range   SARS Coronavirus 2 NEGATIVE NEGATIVE  Type and screen [place]     Status: None   Collection Time:  08/07/19 11:15 AM  Result Value Ref Range   ABO/RH(D) O POS    Antibody Screen NEG    Sample Expiration      08/10/2019,2359 Performed at [facility], [place] [address], [place], [place] [address]   CBC     Status: None   Collection Time: 08/07/19 11:17 AM  Result Value Ref Range   WBC 6.9 4.0 - 10.5 K/uL   RBC 4.64 3.87 - 5.11 MIL/uL   Hemoglobin 13.3 12.0 - 15.0 g/dL   HCT 40.6 36.0 - 46.0 %   MCV 87.5 80.0 - 100.0 fL   MCH 28.7 26.0 - 34.0 pg   MCHC 32.8 30.0 - 36.0 g/dL   RDW 14.1 11.5 - 15.5 %   Platelets 199 150 - 400 K/uL   nRBC 0.0 0.0 - 0.2 %  CBC     Status: Abnormal   Collection Time: 08/08/19  4:18 AM  Result Value Ref Range   WBC 9.9 4.0 - 10.5 K/uL   RBC 3.97 3.87 - 5.11 MIL/uL   Hemoglobin 11.3 (L) 12.0 - 15.0 g/dL   HCT 34.4 (L) 36.0 - 46.0 %   MCV 86.6 80.0 - 100.0 fL   MCH 28.5 26.0 - 34.0 pg   MCHC 32.8 30.0 - 36.0 g/dL   RDW 13.8 11.5 - 15.5 %   Platelets 184 150 - 400 K/uL   nRBC 0.0 0.0 - 0.2 %     CBG (last 3)  No results for input(s): GLUCAP in the last 72 hours.   I/O last 3 completed shifts: In: 492.8 [I.V.:492.8] Out: 614 [Urine:400; Blood:214]   Assessment Postpartum Day # 1 : S/P NSVD due to spontaneous latent labor. Pt stable. -1 involution. Bottle feeding. Hemodynamically stable.   Plan: Continue other mgmt as ordered VTE prophylactics: Early ambulated as tolerates.  Pain control: Motrin/Tylenol PRN Education given regarding options for contraception, including barrier methods, injectable contraception, IUD placement, oral contraceptives.  Plan for discharge tomorrow and Contraception IUD   Dr. Alwyn Pea to be updated on patient status  [facility] NP-C, CNM 08/08/2019, 9:31 AM

## 2019-08-08 NOTE — Anesthesia Postprocedure Evaluation (Signed)
Anesthesia Post Note  Patient: Jacqueline Gates  Procedure(s) Performed: AN AD HOC LABOR EPIDURAL     Patient location during evaluation: Mother Baby Anesthesia Type: Epidural Level of consciousness: awake, awake and alert and oriented Pain management: pain level controlled Vital Signs Assessment: post-procedure vital signs reviewed and stable Respiratory status: spontaneous breathing, nonlabored ventilation and respiratory function stable Cardiovascular status: stable Postop Assessment: no headache, no backache, patient able to bend at knees, no apparent nausea or vomiting, adequate PO intake and able to ambulate Anesthetic complications: no    Last Vitals:  Vitals:   08/08/19 0740 08/08/19 1500  BP: 103/70 102/72  Pulse: 73 78  Resp: 16 14  Temp: 36.8 C 36.8 C  SpO2: 99% 98%    Last Pain:  Vitals:   08/08/19 1500  TempSrc: Oral  PainSc: 0-No pain   Pain Goal: Patients Stated Pain Goal: 3 (08/08/19 0740)                 Muhamed Luecke

## 2019-08-08 NOTE — Clinical Social Work Maternal (Signed)
CLINICAL SOCIAL WORK MATERNAL/CHILD NOTE  Patient Details  Name: Jacqueline Gates MRN: [id number] Date of Birth: 01/06/1991  Date:  08/08/2019  Clinical Social Worker Initiating Note:  Hortencia Pilar, LCSW Date/Time: Initiated:  08/08/19/1020     Child's Name:  Marquette Saa   Biological Parents:  Mother, Father(Shavonda Raiden, Yearwood Nim)   Need for Interpreter:  [place]   Reason for Referral:  Late or No Prenatal Care    Address:  [address] [place] [address]    Phone number:  [phone] (home)     Additional phone number: none   Household Members/Support Persons (HM/SP):   Household Member/Support Person 2, Household Member/Support Person 1   HM/SP Name Relationship DOB or Age  HM/SP -1 Mikhaela Zaugg MOB  29 years old  HM/SP -2 Mardelle Matte Nim  son   04/28/18  HM/SP -3   King Nim  FOB     HM/SP -4   Shon Hale Nim  son   10/20/2016  HM/SP -5        HM/SP -6        HM/SP -7        HM/SP -8          Natural Supports (not living in the home):  Extended Family   Professional Supports: None   Employment: Unemployed   Type of Work: none   Education:  Research scientist (physical sciences)   Homebound arranged:  n/a  Surveyor, quantity Resources:  Medicaid   Other Resources:  [employer], Sales executive (plans to apply for [employer] but gets WIC per FOB.)   Cultural/Religious Considerations Which May Impact Care:  none reported to CSW.   Strengths:  Ability to meet basic needs , Compliance with medical plan , Home prepared for child , Pediatrician chosen   Psychotropic Medications:     None reported.   Pediatrician:    [place]  Pediatrician List:   [facility]  [facility]      Pediatrician Fax Number:    Risk Factors/Current Problems:  None   Cognitive State:  Insightful , Able to Concentrate , Alert    Mood/Affect:  Relaxed , Comfortable , Calm , Interested ,  Happy    CSW Assessment: CSW consulted as MOB started care at 28 weeks. CSW went to speak with MOB at bedside to address further needs.    CSW used [place] interpretor Worthy Rancher [phone] to speak with MOB and FOB. CSW congratulated MOB and FOB on the birth of infant. CSW advised MOB and FOB of the HIPPA policy. MOB reported that it was for FOB to remain in the room while CSW spoke with her. MOB was then advised of CSW's role and the reason for CSW coming to visit with her. MOB reported that she did start care at 28 weeks as a result of COVID. FOB reported that MOB wasn't allowed to have any one come to the visits with her, "which made it more harder for her as she watches the other two children at home". Per FOB, the clinic wouldn't allow anyone else in the office therefore MOB started care later. CSW understanding and advised MOB and FOB of the hospital drug screen policy. CSW advised MOB and FOB that infants UDS was negative however CSW would monitor infants CDS for any other substances. MOB and FOB both understanding and reported no other substance  use during this pregnancy. MOB denies and CPS hx.   CSW inquired from [facility] on he mental health. MOB reported no mental health hx. MOB reports that she has been feeling fine since she gave birth. MOB reported that she and FOB have all needed items to care for infant with no other needs at this time.   CSW took time to provide MOB with PPD and SIDS education. MOB reported that she had a basinet for infant to sleep in once arrived home. MOB reported no other needs to this CSW.  CSW will continue to monitor infants CDS and make CPS report if warranted.   CSW Plan/Description:  No Further Intervention Required/No Barriers to Discharge, Perinatal Mood and Anxiety Disorder (PMADs) Education, Sudden Infant Death Syndrome (SIDS) Education, CSW Will Continue to Monitor Umbilical Cord Tissue Drug Screen Results and Make Report if Warranted, [place], [place] 08/08/2019, 10:42 AM

## 2019-08-09 MED ORDER — IBUPROFEN 600 MG PO TABS: 600.0000 mg | ORAL_TABLET | Freq: Four times a day (QID) | ORAL | 0 refills | Status: AC

## 2019-08-09 NOTE — Discharge Summary (Signed)
Postpartum Discharge Summary  Date of Service updated 08/09/19    Patient Name: Jacqueline Gates DOB: 1990-04-05 MRN: [id number]  Date of admission: 08/07/2019 Delivery date:08/07/2019  Delivering provider: Burman Foster B  Date of discharge: 08/09/2019  Admitting diagnosis: Normal labor [O80, Z37.9] Intrauterine pregnancy: [redacted]w[redacted]d    Secondary diagnosis:  Active Problems:   SVD (6/6)   Normal labor   Late prenatal care  Additional problems: none    Discharge diagnosis: Term Pregnancy Delivered                                              Post partum procedures:none Augmentation: Pitocin Complications: None  Hospital course: Onset of Labor With Vaginal Delivery      29y.o. yo [id number] admitted in Latent Labor on 08/07/2019. Patient had an uncomplicated labor course as follows:  Membrane Rupture Time/Date: 4:09 PM ,08/07/2019   Delivery Method:Vaginal, Spontaneous  Episiotomy: None  Lacerations:  None  Patient had an uncomplicated postpartum course.  She is ambulating, tolerating a regular diet, passing flatus, and urinating well. Patient is discharged home in stable condition on 08/09/19.  Newborn Data: Birth date:08/07/2019  Birth time:4:40 PM  Gender:Female  Living status:Living  Apgars:9 ,9  Weight:2795 g   Magnesium Sulfate received: No BMZ received: No Rhophylac:N/A MMR:N/A Transfusion:No  Physical exam  Vitals:   08/08/19 0740 08/08/19 1500 08/08/19 2118 08/09/19 0435  BP: 103/70 102/72 96/69 110/76  Pulse: 73 78 85 68  Resp: '16 14 16 16  ' Temp: 98.3 F (36.8 C) 98.3 F (36.8 C) 98.3 F (36.8 C) 98.1 F (36.7 C)  TempSrc: Oral Oral Oral Oral  SpO2: 99% 98% 98% 99%  Weight:      Height:       General: alert, cooperative and no distress Lochia: appropriate Uterine Fundus: firm Incision: N/A DVT Evaluation: No evidence of DVT seen on physical exam. No cords or calf tenderness. No significant calf/ankle edema. Labs: Lab Results   Component Value Date   WBC 9.9 08/08/2019   HGB 11.3 (L) 08/08/2019   HCT 34.4 (L) 08/08/2019   MCV 86.6 08/08/2019   PLT 184 08/08/2019   CMP Latest Ref Rng & Units 04/05/2018  Glucose 65 - 99 mg/dL -  BUN 6 - 20 mg/dL -  Creatinine 0.44 - 1.00 mg/dL -  Sodium 135 - 145 mmol/L -  Potassium 3.5 - 5.1 mmol/L -  Chloride 101 - 111 mmol/L -  CO2 22 - 32 mmol/L -  Calcium 8.9 - 10.3 mg/dL -  Total Protein 6.0 - 8.5 g/dL 6.5  Total Bilirubin 0.0 - 1.2 mg/dL 0.2  Alkaline Phos 39 - 117 IU/L 193(H)  AST 0 - 40 IU/L 18  ALT 0 - 32 IU/L 9   Edinburgh Score: Edinburgh Postnatal Depression Scale Screening Tool 08/08/2019  I have been able to laugh and see the funny side of things. 0  I have looked forward with enjoyment to things. 0  I have blamed myself unnecessarily when things went wrong. 2  I have been anxious or worried for no good reason. 0  I have felt scared or panicky for no good reason. 0  Things have been getting on top of me. 0  I have been so unhappy that I have had difficulty sleeping. 0  I have felt sad  or miserable. 0  I have been so unhappy that I have been crying. 0  The thought of harming myself has occurred to me. 0  Edinburgh Postnatal Depression Scale Total 2      After visit meds:  Allergies as of 08/09/2019   No Known Allergies     Medication List    TAKE these medications   ibuprofen 600 MG tablet Commonly known as: ADVIL Take 1 tablet (600 mg total) by mouth every 6 (six) hours. What changed:   medication strength  how much to take  when to take this   Prenatal Gummies/DHA & FA 0.4-32.5 MG Chew Chew 1 each by mouth daily.   senna-docusate 8.6-50 MG tablet Commonly known as: Senokot-S Take 2 tablets by mouth at bedtime as needed for mild constipation.        Discharge home in stable condition Infant Feeding: Breast Infant Disposition:home with mother Discharge instruction: per After Visit Summary and Postpartum booklet. Activity:  Advance as tolerated. Pelvic rest for 6 weeks.  Diet: routine diet Anticipated Birth Control: IUD Postpartum Appointment:6 weeks Additional Postpartum F/U: none Future Appointments:No future appointments. Follow up Visit: [facility]. Schedule an appointment as soon as possible for a visit in 6 week(s).   Specialty: Obstetrics and Gynecology Contact information: [address] [place] [address] [phone]              08/09/2019 Arrie Eastern, CNM

## 2020-06-13 IMAGING — US US MFM OB COMP +14 WKS
1 series · 14 of 28 positions shown · non-contrast
Comparison: none

[Series 1: us mfm ob comp +14 wks · 107 acquisitions, 14 frames shown]
[im 4/107]
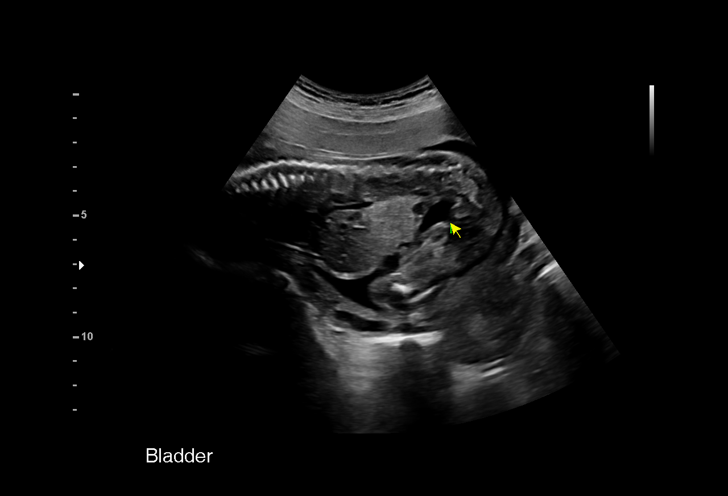
[im 12/107]
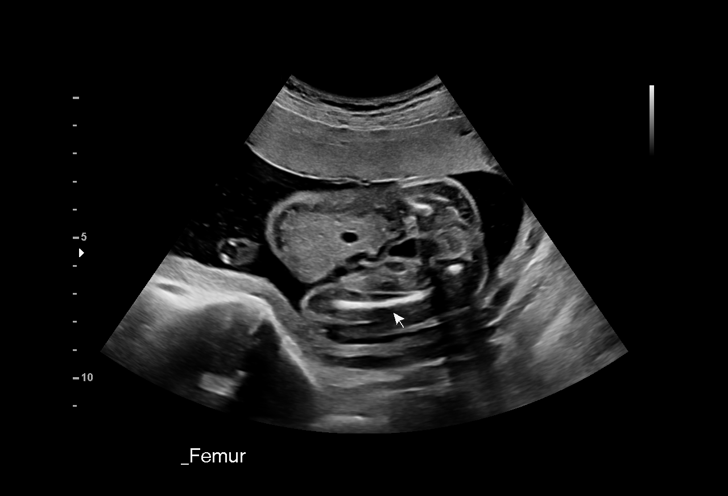
[im 20/107]
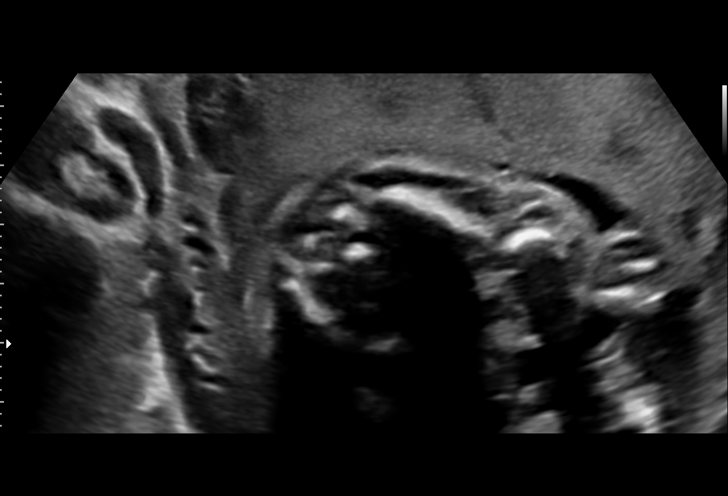
[im 28/107]
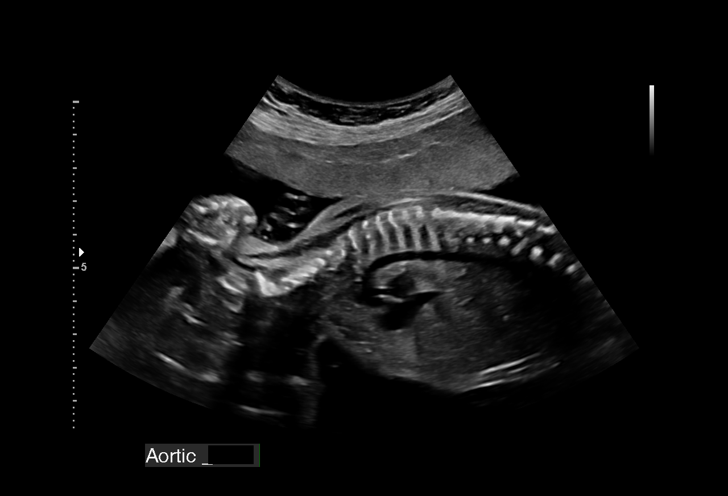
[im 36/107]
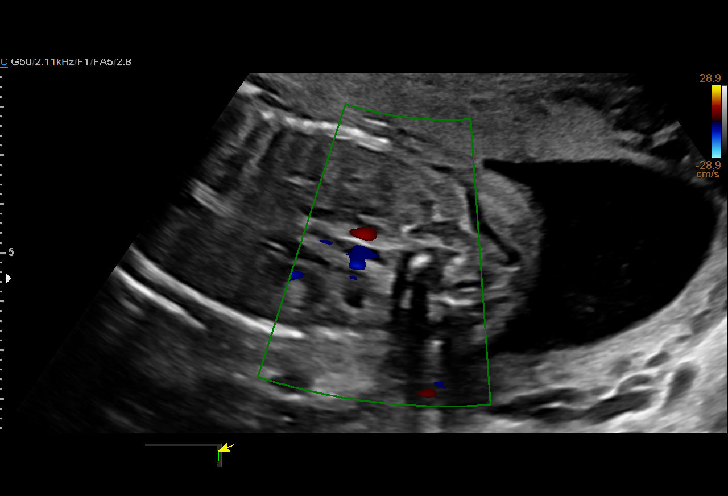
[im 44/107]
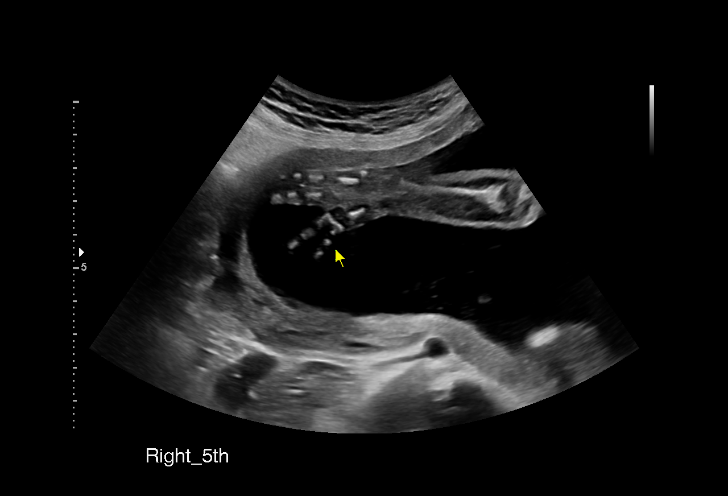
[im 52/107]
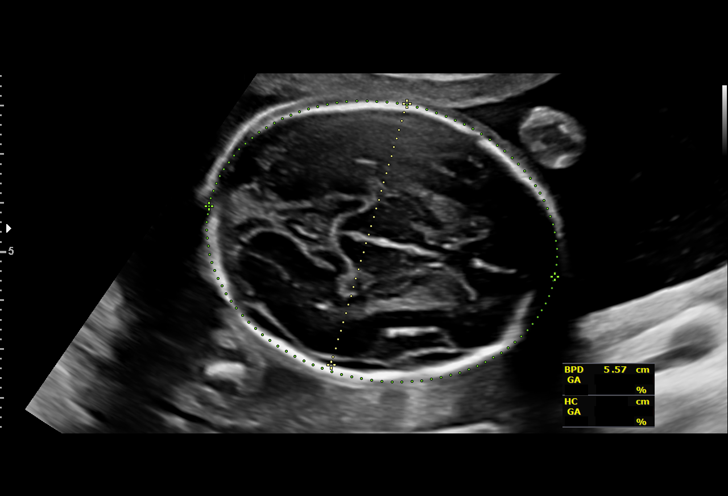
[im 59/107]
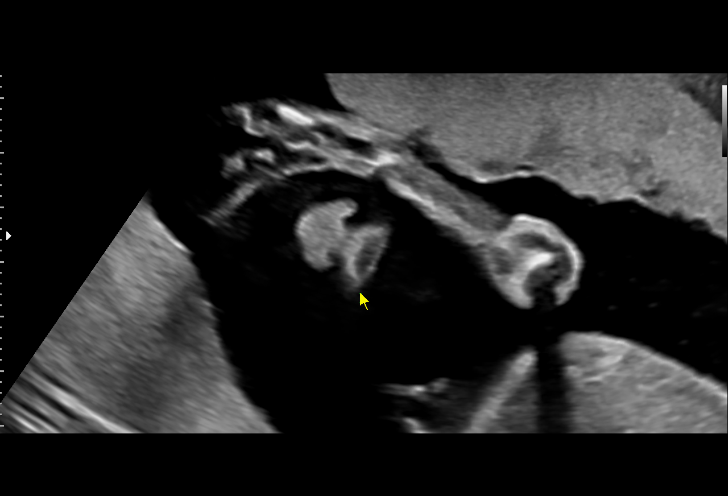
[im 67/107]
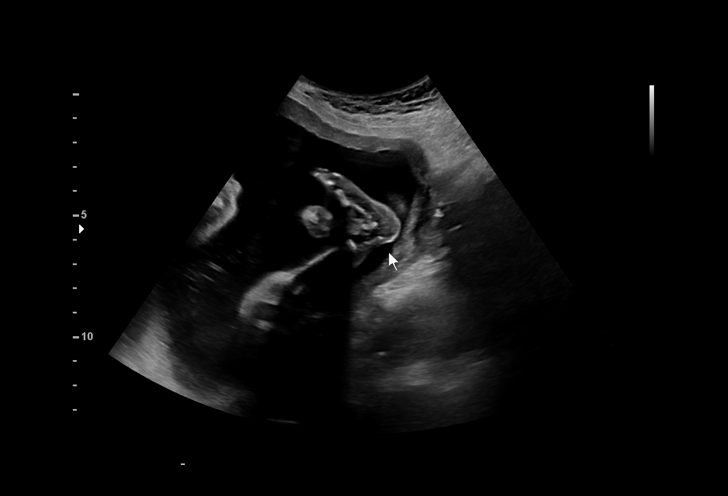
[im 75/107]
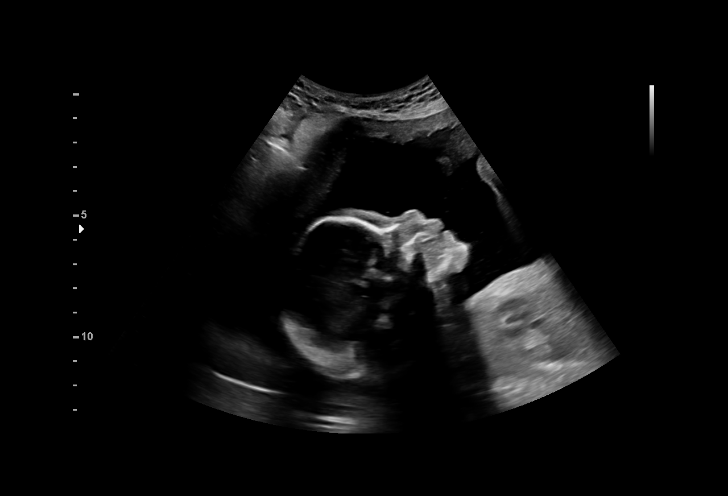
[im 83/107]
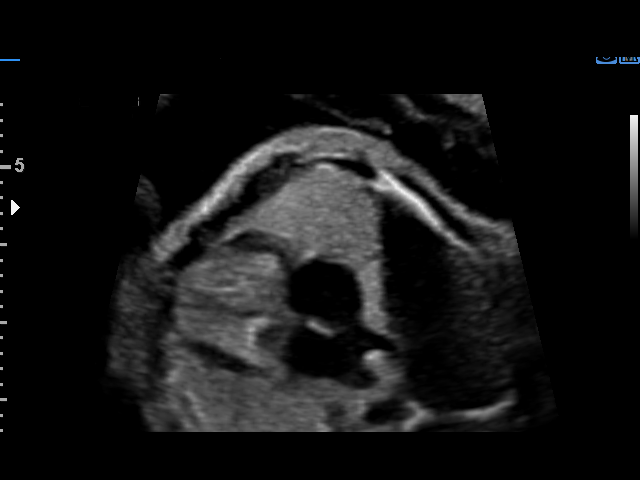
[im 91/107]
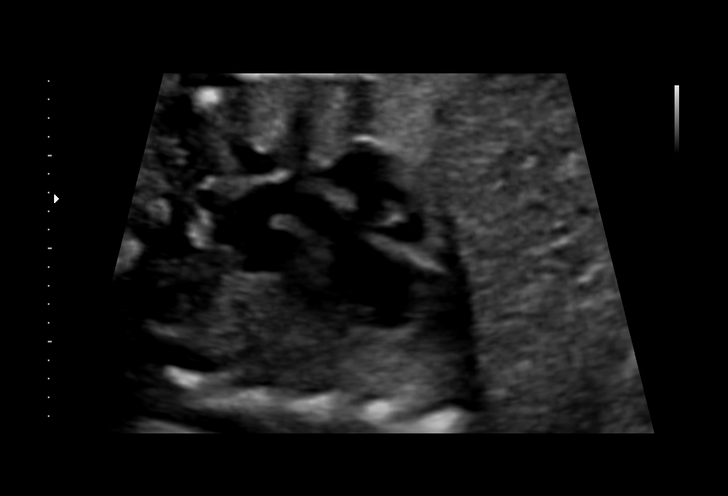
[im 99/107]
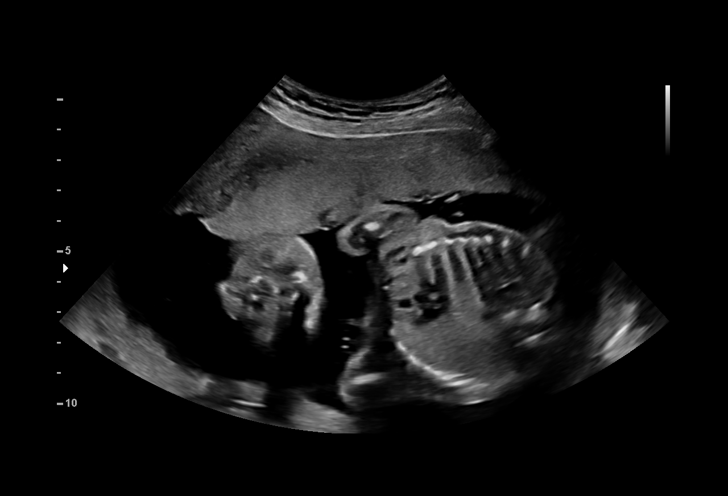
[im 107/107]
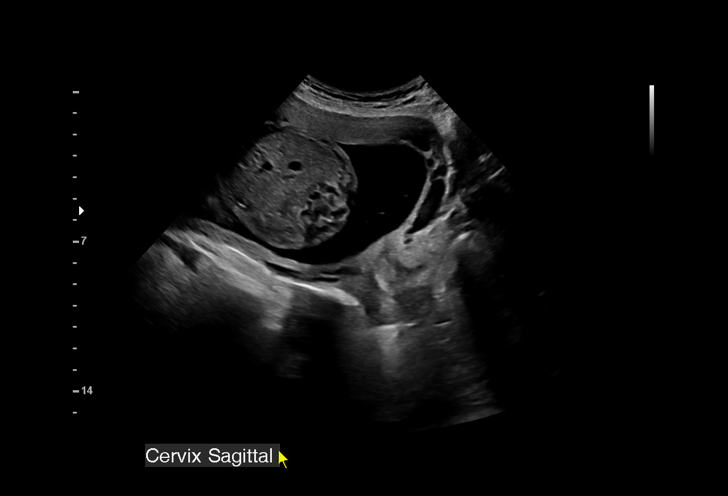

[14 of 28 positions shown; findings below may reference images not displayed]

[REDACTED]. [HOSPITAL],
                   FELMIN CNM

 ----------------------------------------------------------------------

 ----------------------------------------------------------------------
Indications

  Encounter for antenatal screening for
  malformations
  22 weeks gestation of pregnancy
 ----------------------------------------------------------------------
Vital Signs

 BMI:
Fetal Evaluation

 Num Of Fetuses:          1
 Fetal Heart Rate(bpm):   153
 Cardiac Activity:        Observed
 Presentation:            Breech
 Placenta:                Anterior
 P. Cord Insertion:       Visualized

 Amniotic Fluid
 AFI FV:      Within normal limits

                             Largest Pocket(cm)

Biometry

 BPD:      55.3  mm     G. Age:  22w 6d         46  %    CI:        72.85   %    70 - 86
                                                         FL/HC:       17.7  %    19.2 -
 HC:       206   mm     G. Age:  22w 5d         30  %    HC/AC:       1.06       1.05 -
 AC:       195   mm     G. Age:  24w 1d         81  %    FL/BPD:      65.8  %    71 - 87
 FL:       36.4  mm     G. Age:  21w 4d          8  %    FL/AC:       18.7  %    20 - 24
 HUM:      34.6  mm     G. Age:  21w 6d         22  %
 CER:      25.5  mm     G. Age:  23w 4d         60  %
 LV:        5.2  mm
 CM:          6  mm

 Est. FW:     554   gm     1 lb 4 oz     55  %
OB History

 Gravidity:    2         Term:   1
 Living:       1
Gestational Age

 LMP:           19w 1d        Date:  08/30/17                 EDD:   06/06/18
 U/S Today:     22w 6d                                        EDD:   05/11/18
 Best:          22w 6d     Det. By:  U/S (01/11/18)           EDD:   05/11/18
Anatomy

 Cranium:               Appears normal         LVOT:                   Appears normal
 Cavum:                 Appears normal         Aortic Arch:            Appears normal
 Ventricles:            Appears normal         Ductal Arch:            Appears normal
 Choroid Plexus:        Appears normal         Diaphragm:              Appears normal
 Cerebellum:            Appears normal         Stomach:                Appears normal, left
                                                                       sided
 Posterior Fossa:       Appears normal         Abdomen:                Appears normal
 Nuchal Fold:           Not applicable (>20    Abdominal Wall:         Appears nml (cord
                        wks GA)                                        insert, abd wall)
 Face:                  Appears normal         Cord Vessels:           Appears normal (3
                        (orbits and profile)                           vessel cord)
 Lips:                  Appears normal         Kidneys:                Appear normal
 Palate:                Not well visualized    Bladder:                Appears normal
 Thoracic:              Appears normal         Spine:                  Appears normal
 Heart:                 Appears normal         Upper Extremities:      Appears normal
                        (4CH, axis, and situs
 RVOT:                  Appears normal         Lower Extremities:      Appears normal

 Other:  Fetus appears to be a male. Heels and 5th digit visualized. Open
         hands visualized. Nasal bone visualized.
Cervix Uterus Adnexa

 Cervix
 Length:            4.1  cm.
 Normal appearance by transabdominal scan.

 Uterus
 No abnormality visualized.

 Left Ovary
 Not visualized.

 Right Ovary
 Size(cm)       3.1  x   1.6    x  1.7       Vol(ml):
 Within normal limits.
Impression

 Normal interval growth.
Recommendations
 Follow up growth in 4 weeks
